# Patient Record
Sex: Female | Born: 1950
Health system: Southern US, Community
[De-identification: ages and names within clinical notes are randomized; demographics above are authoritative.]

## PROBLEM LIST (undated history)

## (undated) DIAGNOSIS — E079 Disorder of thyroid, unspecified: Secondary | ICD-10-CM

## (undated) DIAGNOSIS — I499 Cardiac arrhythmia, unspecified: Secondary | ICD-10-CM

## (undated) DIAGNOSIS — I10 Essential (primary) hypertension: Secondary | ICD-10-CM

## (undated) DIAGNOSIS — L509 Urticaria, unspecified: Secondary | ICD-10-CM

## (undated) HISTORY — DX: Urticaria, unspecified: L50.9

## (undated) HISTORY — PX: BREAST SURGERY: SHX581

## (undated) HISTORY — PX: BREAST EXCISIONAL BIOPSY: SUR124

## (undated) HISTORY — DX: Cardiac arrhythmia, unspecified: I49.9

## (undated) HISTORY — DX: Disorder of thyroid, unspecified: E07.9

## (undated) HISTORY — DX: Essential (primary) hypertension: I10

## (undated) HISTORY — PX: HERNIA REPAIR: SHX51

---

## 1983-12-23 HISTORY — PX: BREAST SURGERY: SHX581

## 1998-12-12 ENCOUNTER — Other Ambulatory Visit: Admission: RE | Admit: 1998-12-12 | Discharge: 1998-12-12 | Payer: Self-pay | Admitting: Gynecology

## 1999-05-09 ENCOUNTER — Other Ambulatory Visit: Admission: RE | Admit: 1999-05-09 | Discharge: 1999-05-09 | Payer: Self-pay | Admitting: Gynecology

## 1999-05-09 ENCOUNTER — Inpatient Hospital Stay (HOSPITAL_COMMUNITY): Admission: AD | Admit: 1999-05-09 | Discharge: 1999-05-09 | Payer: Self-pay | Admitting: Gynecology

## 1999-06-20 ENCOUNTER — Encounter (INDEPENDENT_AMBULATORY_CARE_PROVIDER_SITE_OTHER): Payer: Self-pay | Admitting: Specialist

## 1999-06-20 ENCOUNTER — Inpatient Hospital Stay (HOSPITAL_COMMUNITY): Admission: RE | Admit: 1999-06-20 | Discharge: 1999-06-21 | Payer: Self-pay | Admitting: Gynecology

## 1999-08-22 ENCOUNTER — Other Ambulatory Visit: Admission: RE | Admit: 1999-08-22 | Discharge: 1999-08-22 | Payer: Self-pay | Admitting: Gynecology

## 1999-09-05 ENCOUNTER — Encounter: Payer: Self-pay | Admitting: Surgery

## 1999-09-05 ENCOUNTER — Ambulatory Visit (HOSPITAL_COMMUNITY): Admission: RE | Admit: 1999-09-05 | Discharge: 1999-09-05 | Payer: Self-pay | Admitting: Surgery

## 1999-09-26 ENCOUNTER — Ambulatory Visit (HOSPITAL_BASED_OUTPATIENT_CLINIC_OR_DEPARTMENT_OTHER): Admission: RE | Admit: 1999-09-26 | Discharge: 1999-09-26 | Payer: Self-pay | Admitting: Surgery

## 2000-07-07 ENCOUNTER — Other Ambulatory Visit: Admission: RE | Admit: 2000-07-07 | Discharge: 2000-07-07 | Payer: Self-pay | Admitting: Gynecology

## 2001-06-18 ENCOUNTER — Other Ambulatory Visit: Admission: RE | Admit: 2001-06-18 | Discharge: 2001-06-18 | Payer: Self-pay | Admitting: Gynecology

## 2002-06-30 ENCOUNTER — Other Ambulatory Visit: Admission: RE | Admit: 2002-06-30 | Discharge: 2002-06-30 | Payer: Self-pay | Admitting: Gynecology

## 2003-10-19 ENCOUNTER — Other Ambulatory Visit: Admission: RE | Admit: 2003-10-19 | Discharge: 2003-10-19 | Payer: Self-pay | Admitting: Gynecology

## 2004-08-21 ENCOUNTER — Encounter: Admission: RE | Admit: 2004-08-21 | Discharge: 2004-08-21 | Payer: Self-pay | Admitting: Internal Medicine

## 2004-10-21 ENCOUNTER — Other Ambulatory Visit: Admission: RE | Admit: 2004-10-21 | Discharge: 2004-10-21 | Payer: Self-pay | Admitting: Gynecology

## 2005-09-23 ENCOUNTER — Encounter: Admission: RE | Admit: 2005-09-23 | Discharge: 2005-09-23 | Payer: Self-pay | Admitting: Gynecology

## 2005-12-16 ENCOUNTER — Other Ambulatory Visit: Admission: RE | Admit: 2005-12-16 | Discharge: 2005-12-16 | Payer: Self-pay | Admitting: Gynecology

## 2006-11-03 ENCOUNTER — Encounter: Admission: RE | Admit: 2006-11-03 | Discharge: 2006-11-03 | Payer: Self-pay | Admitting: Gynecology

## 2006-12-21 ENCOUNTER — Other Ambulatory Visit: Admission: RE | Admit: 2006-12-21 | Discharge: 2006-12-21 | Payer: Self-pay | Admitting: Gynecology

## 2007-12-21 ENCOUNTER — Other Ambulatory Visit: Admission: RE | Admit: 2007-12-21 | Discharge: 2007-12-21 | Payer: Self-pay | Admitting: Gynecology

## 2008-04-06 ENCOUNTER — Encounter: Admission: RE | Admit: 2008-04-06 | Discharge: 2008-04-06 | Payer: Self-pay | Admitting: Gynecology

## 2009-01-19 ENCOUNTER — Encounter: Admission: RE | Admit: 2009-01-19 | Discharge: 2009-01-19 | Payer: Self-pay | Admitting: Family Medicine

## 2009-06-08 ENCOUNTER — Encounter: Admission: RE | Admit: 2009-06-08 | Discharge: 2009-06-08 | Payer: Self-pay | Admitting: Gynecology

## 2010-10-10 ENCOUNTER — Encounter: Admission: RE | Admit: 2010-10-10 | Discharge: 2010-10-10 | Payer: Self-pay | Admitting: Gynecology

## 2011-12-01 ENCOUNTER — Ambulatory Visit: Payer: Self-pay | Admitting: Cardiology

## 2011-12-18 ENCOUNTER — Ambulatory Visit: Payer: Self-pay | Admitting: Cardiology

## 2011-12-19 ENCOUNTER — Encounter: Payer: Self-pay | Admitting: Cardiology

## 2011-12-19 ENCOUNTER — Ambulatory Visit (INDEPENDENT_AMBULATORY_CARE_PROVIDER_SITE_OTHER): Payer: 59 | Admitting: Cardiology

## 2011-12-19 VITALS — BP 122/80 | HR 62 | Ht 62.0 in | Wt 101.1 lb

## 2011-12-19 DIAGNOSIS — R011 Cardiac murmur, unspecified: Secondary | ICD-10-CM

## 2011-12-19 DIAGNOSIS — I119 Hypertensive heart disease without heart failure: Secondary | ICD-10-CM

## 2011-12-19 DIAGNOSIS — R002 Palpitations: Secondary | ICD-10-CM

## 2011-12-19 NOTE — Assessment & Plan Note (Signed)
The patient's blood pressure is borderline elevated today when it was first taken.  On repeat it was down to 122/80.  She should avoid added salt

## 2011-12-19 NOTE — Progress Notes (Signed)
Irving Shows Date of Birth:  05/20/1951 [facility] [address]   [address] [place], [place]  [address] [phone]           Fax   [phone]  History of Present Illness: This pleasant 60 year old married Caucasian female is seen after a long absence.  Her old records are not presently obtainable.  We saw her many years ago for palpitations.  She has a history of a known heart murmur.  She does not recall ever having had an echocardiogram. She comes in today for evaluation of palpitations.  Her palpitations are very infrequent.  They start suddenly.  They last only a few minutes and then gradually subside.  She had several episodes last summer and she has had occasional episodes waking her up at night.  He has not had any recent spells.  She has noticed that coffee or ice T. are triggers for her palpitations.  Current Outpatient Prescriptions  Medication Sig Dispense Refill  . aspirin 81 MG tablet Take 81 mg by mouth daily.        . calcium citrate-vitamin D (CITRACAL+D) 315-200 MG-UNIT per tablet Take 1 tablet by mouth 2 (two) times daily.        Marland Kitchen losartan (COZAAR) 100 MG tablet Take 100 mg by mouth daily.        . nitrofurantoin (MACRODANTIN) 100 MG capsule Take 100 mg by mouth at bedtime.        . vitamin B-12 (CYANOCOBALAMIN) 1000 MCG tablet Take 1,000 mcg by mouth daily.          Allergies  Allergen Reactions  . Codeine     There is no problem list on file for this patient.   History  Smoking status  . Never Smoker   Smokeless tobacco  . Not on file    History  Alcohol Use No    Family History  Problem Relation Age of Onset  . Hypertension Mother   . Heart attack Father   . Heart disease Father   . Hypertension Father   . Diabetes Father   . Hypertension Brother     Review of Systems: Constitutional: no fever chills diaphoresis or fatigue or change in weight.  Head and neck: no hearing loss, no epistaxis, no  photophobia or visual disturbance. Respiratory: No cough, shortness of breath or wheezing. Cardiovascular: No chest pain peripheral edema, palpitations. Gastrointestinal: No abdominal distention, no abdominal pain, no change in bowel habits hematochezia or melena. Genitourinary: No dysuria, no frequency, no urgency, no nocturia. Musculoskeletal:No arthralgias, no back pain, no gait disturbance or myalgias. Neurological: No dizziness, no headaches, no numbness, no seizures, no syncope, no weakness, no tremors. Hematologic: No lymphadenopathy, no easy bruising. Psychiatric: No confusion, no hallucinations, no sleep disturbance.    Physical Exam: Filed Vitals:   12/19/11 1532  BP: 122/80  Pulse: 62  The general appearance reveals a well-developed well-nourished woman in no distress.Pupils equal and reactive.   Extraocular Movements are full.  There is no scleral icterus.  The mouth and pharynx are normal.  The neck is supple.  The carotids reveal no bruits.  The jugular venous pressure is normal.  The thyroid is not enlarged.  There is no lymphadenopathy.  The chest is clear to percussion and auscultation. There are no rales or rhonchi. Expansion of the chest is symmetrical.  Heart reveals a grade 1/6 apical systolic murmur.  No definite click.  No gallop or rub.  No  diastolic murmur.The abdomen is soft and nontender. Bowel sounds are normal. The liver and spleen are not enlarged. There Are no abdominal masses. There are no bruits.  The pedal pulses are good.  There is no phlebitis or edema.  There is no cyanosis or clubbing. Strength is normal and symmetrical in all extremities.  There is no lateralizing weakness.  There are no sensory deficits.  The skin is warm and dry.  There is no rash.  EKG today shows sinus rhythm and is within normal limits   Assessment / Plan: My impression is that the patient is probably having short runs of SVT as the cause of her palpitations.  The  palpitations are very infrequent.  She really has not had a severe spell since June.  We will get echocardiogram and chest x-ray and plan to see her back in 6 months.  If episodes become more frequent we will try to document what they are with a event monitor

## 2011-12-19 NOTE — Assessment & Plan Note (Signed)
The patient has a soft apical systolic murmur.  She does not have any symptoms of congestive heart failure.  Her EKG is normal.  We are sending her for an chest x-ray and an echocardiogram

## 2011-12-19 NOTE — Patient Instructions (Signed)
Will have you go for Chest Xray soon and call you with the results  Your physician has requested that you have an echocardiogram. Echocardiography is a painless test that uses sound waves to create images of your heart. It provides your doctor with information about the size and shape of your heart and how well your heart's chambers and valves are working. This procedure takes approximately one hour. There are no restrictions for this procedure.  Your physician recommends that you continue on your current medications as directed. Please refer to the Current Medication list given to you today.  Your physician wants you to follow-up in: 6 month You will receive a reminder letter in the mail two months in advance. If you don't receive a letter, please call our office to schedule the follow-up appointment.

## 2011-12-19 NOTE — Assessment & Plan Note (Signed)
Her palpitations are very infrequent and they are very brief period.  There is no reason at this point to have her wear a event monitor.  We did not today about maneuvers that she could try to shorten the episodes such as Valsalva maneuver.  I suspect that what she is having is short runs of SVT.  Her EKG today is normal.  There is no evidence of preexcitation.

## 2011-12-22 ENCOUNTER — Ambulatory Visit (INDEPENDENT_AMBULATORY_CARE_PROVIDER_SITE_OTHER)
Admission: RE | Admit: 2011-12-22 | Discharge: 2011-12-22 | Disposition: A | Discharge status: Home / Self Care | Payer: 59 | Source: Ambulatory Visit | Attending: Cardiology | Admitting: Cardiology

## 2011-12-22 DIAGNOSIS — R002 Palpitations: Secondary | ICD-10-CM

## 2011-12-22 DIAGNOSIS — R011 Cardiac murmur, unspecified: Secondary | ICD-10-CM

## 2011-12-22 DIAGNOSIS — I119 Hypertensive heart disease without heart failure: Secondary | ICD-10-CM

## 2011-12-25 ENCOUNTER — Telehealth: Payer: Self-pay | Admitting: *Deleted

## 2011-12-25 ENCOUNTER — Telehealth: Payer: Self-pay | Admitting: Cardiology

## 2011-12-25 NOTE — Telephone Encounter (Signed)
Patient returned call and advised of chest Xray results

## 2011-12-25 NOTE — Telephone Encounter (Signed)
Message copied by Burnell Blanks on Thu Dec 25, 2011  9:38 AM ------      Message from: Cassell Clement      Created: Tue Dec 23, 2011  8:12 AM       Xray normal.  Please report.

## 2011-12-29 ENCOUNTER — Ambulatory Visit (HOSPITAL_COMMUNITY): Payer: 59 | Attending: Cardiology | Admitting: Radiology

## 2011-12-29 DIAGNOSIS — R002 Palpitations: Secondary | ICD-10-CM | POA: Insufficient documentation

## 2011-12-29 DIAGNOSIS — I10 Essential (primary) hypertension: Secondary | ICD-10-CM | POA: Insufficient documentation

## 2011-12-29 DIAGNOSIS — R011 Cardiac murmur, unspecified: Secondary | ICD-10-CM | POA: Insufficient documentation

## 2011-12-29 DIAGNOSIS — I119 Hypertensive heart disease without heart failure: Secondary | ICD-10-CM

## 2012-01-05 NOTE — Telephone Encounter (Signed)
Left message

## 2012-01-05 NOTE — Telephone Encounter (Signed)
FU Call: Pt returning call from our office regarding pt ECHO results. Please return pt call to discuss further.  

## 2012-01-06 ENCOUNTER — Telehealth: Payer: Self-pay | Admitting: *Deleted

## 2012-01-06 NOTE — Telephone Encounter (Signed)
Echo results given to patient. 

## 2012-01-06 NOTE — Telephone Encounter (Signed)
Advised of results

## 2012-01-06 NOTE — Telephone Encounter (Signed)
F/U  Patient returning Nurse MP call.  Patient can be reached on mobile.

## 2012-01-06 NOTE — Telephone Encounter (Signed)
Message copied by Burnell Blanks on Tue Jan 06, 2012  5:46 PM ------      Message from: Cassell Clement      Created: Mon Dec 29, 2011  9:40 PM       Please report.  Echo shows normal LV systolic function.  There is mild MR and AI which account for the murmur.  No new Rx needed at this time.

## 2015-10-29 ENCOUNTER — Telehealth: Payer: Self-pay | Admitting: Cardiology

## 2015-10-29 NOTE — Telephone Encounter (Signed)
Spoke with patient and she stated she was sick a few weeks ago and that is when she noticed.  Patient was concerned and felt needed to be checked out Advised to start with seeing PCP and if he felt cardiac related he could get her an appointment with someone. Per  Dr. Mare Ferrari Did advise if she did need to see cardiologist it would not be with  Dr. Mare Ferrari since he is retiring soon

## 2015-10-29 NOTE — Telephone Encounter (Signed)
New Message  Pt c/o Shortness Of Breath: STAT if SOB developed within the last 24 hours or pt is noticeably SOB on the phone  1. Are you currently SOB (can you hear that pt is SOB on the phone)? No  2. How long have you been experiencing SOB? 24- 48 Hours  3. Are you SOB when sitting or when up moving around? Moving around .Marland Kitchen Walking  4.  Are you currently experiencing any other symptoms? No  Comments: Pt is not established! Last seen 2012. Ok per [employer]. Pt is aware that they are not established. Would prefer to see Dr. Mare Ferrari. Please assist.

## 2016-04-25 DIAGNOSIS — L308 Other specified dermatitis: Secondary | ICD-10-CM | POA: Diagnosis not present

## 2016-04-25 DIAGNOSIS — L218 Other seborrheic dermatitis: Secondary | ICD-10-CM | POA: Diagnosis not present

## 2016-04-25 DIAGNOSIS — L718 Other rosacea: Secondary | ICD-10-CM | POA: Diagnosis not present

## 2016-05-22 DIAGNOSIS — Z681 Body mass index (BMI) 19 or less, adult: Secondary | ICD-10-CM | POA: Diagnosis not present

## 2016-05-22 DIAGNOSIS — Z01419 Encounter for gynecological examination (general) (routine) without abnormal findings: Secondary | ICD-10-CM | POA: Diagnosis not present

## 2016-05-22 DIAGNOSIS — Z1231 Encounter for screening mammogram for malignant neoplasm of breast: Secondary | ICD-10-CM | POA: Diagnosis not present

## 2016-05-22 DIAGNOSIS — E559 Vitamin D deficiency, unspecified: Secondary | ICD-10-CM | POA: Diagnosis not present

## 2016-07-02 DIAGNOSIS — M810 Age-related osteoporosis without current pathological fracture: Secondary | ICD-10-CM | POA: Diagnosis not present

## 2016-07-02 DIAGNOSIS — E039 Hypothyroidism, unspecified: Secondary | ICD-10-CM | POA: Diagnosis not present

## 2016-07-02 DIAGNOSIS — E063 Autoimmune thyroiditis: Secondary | ICD-10-CM | POA: Diagnosis not present

## 2016-07-02 DIAGNOSIS — I10 Essential (primary) hypertension: Secondary | ICD-10-CM | POA: Diagnosis not present

## 2016-07-02 DIAGNOSIS — Z1211 Encounter for screening for malignant neoplasm of colon: Secondary | ICD-10-CM | POA: Diagnosis not present

## 2016-07-02 DIAGNOSIS — Z1239 Encounter for other screening for malignant neoplasm of breast: Secondary | ICD-10-CM | POA: Diagnosis not present

## 2016-07-02 DIAGNOSIS — Z23 Encounter for immunization: Secondary | ICD-10-CM | POA: Diagnosis not present

## 2016-09-06 DIAGNOSIS — J069 Acute upper respiratory infection, unspecified: Secondary | ICD-10-CM | POA: Diagnosis not present

## 2016-11-12 DIAGNOSIS — J069 Acute upper respiratory infection, unspecified: Secondary | ICD-10-CM | POA: Diagnosis not present

## 2017-01-10 DIAGNOSIS — J069 Acute upper respiratory infection, unspecified: Secondary | ICD-10-CM | POA: Diagnosis not present

## 2017-01-10 DIAGNOSIS — R05 Cough: Secondary | ICD-10-CM | POA: Diagnosis not present

## 2017-02-23 DIAGNOSIS — Z1211 Encounter for screening for malignant neoplasm of colon: Secondary | ICD-10-CM | POA: Diagnosis not present

## 2017-06-17 DIAGNOSIS — R252 Cramp and spasm: Secondary | ICD-10-CM | POA: Diagnosis not present

## 2017-06-17 DIAGNOSIS — E039 Hypothyroidism, unspecified: Secondary | ICD-10-CM | POA: Diagnosis not present

## 2017-06-17 DIAGNOSIS — Z13220 Encounter for screening for lipoid disorders: Secondary | ICD-10-CM | POA: Diagnosis not present

## 2017-06-17 DIAGNOSIS — N952 Postmenopausal atrophic vaginitis: Secondary | ICD-10-CM | POA: Diagnosis not present

## 2017-06-17 DIAGNOSIS — Z0000 Encounter for general adult medical examination without abnormal findings: Secondary | ICD-10-CM | POA: Diagnosis not present

## 2017-06-17 DIAGNOSIS — M810 Age-related osteoporosis without current pathological fracture: Secondary | ICD-10-CM | POA: Diagnosis not present

## 2017-06-17 DIAGNOSIS — Z1159 Encounter for screening for other viral diseases: Secondary | ICD-10-CM | POA: Diagnosis not present

## 2017-06-17 DIAGNOSIS — I10 Essential (primary) hypertension: Secondary | ICD-10-CM | POA: Diagnosis not present

## 2017-06-22 ENCOUNTER — Other Ambulatory Visit: Payer: Self-pay | Admitting: Family Medicine

## 2017-06-22 DIAGNOSIS — Z1231 Encounter for screening mammogram for malignant neoplasm of breast: Secondary | ICD-10-CM

## 2017-07-14 ENCOUNTER — Ambulatory Visit
Admission: RE | Admit: 2017-07-14 | Discharge: 2017-07-14 | Disposition: A | Discharge status: Home / Self Care | Payer: PPO | Source: Ambulatory Visit | Attending: Family Medicine | Admitting: Family Medicine

## 2017-07-14 DIAGNOSIS — Z1231 Encounter for screening mammogram for malignant neoplasm of breast: Secondary | ICD-10-CM | POA: Diagnosis not present

## 2017-08-10 DIAGNOSIS — M810 Age-related osteoporosis without current pathological fracture: Secondary | ICD-10-CM | POA: Diagnosis not present

## 2017-08-25 DIAGNOSIS — M25572 Pain in left ankle and joints of left foot: Secondary | ICD-10-CM | POA: Diagnosis not present

## 2018-05-19 DIAGNOSIS — H5203 Hypermetropia, bilateral: Secondary | ICD-10-CM | POA: Diagnosis not present

## 2018-05-20 DIAGNOSIS — L299 Pruritus, unspecified: Secondary | ICD-10-CM | POA: Diagnosis not present

## 2018-06-21 ENCOUNTER — Other Ambulatory Visit: Payer: Self-pay | Admitting: Family Medicine

## 2018-06-21 DIAGNOSIS — Z1231 Encounter for screening mammogram for malignant neoplasm of breast: Secondary | ICD-10-CM

## 2018-07-16 ENCOUNTER — Ambulatory Visit
Admission: RE | Admit: 2018-07-16 | Discharge: 2018-07-16 | Disposition: A | Discharge status: Home / Self Care | Payer: PPO | Source: Ambulatory Visit | Attending: Family Medicine | Admitting: Family Medicine

## 2018-07-16 DIAGNOSIS — Z1231 Encounter for screening mammogram for malignant neoplasm of breast: Secondary | ICD-10-CM | POA: Diagnosis not present

## 2018-09-23 DIAGNOSIS — L218 Other seborrheic dermatitis: Secondary | ICD-10-CM | POA: Diagnosis not present

## 2018-11-22 DIAGNOSIS — L509 Urticaria, unspecified: Secondary | ICD-10-CM | POA: Diagnosis not present

## 2018-11-22 DIAGNOSIS — L218 Other seborrheic dermatitis: Secondary | ICD-10-CM | POA: Diagnosis not present

## 2018-12-03 DIAGNOSIS — J040 Acute laryngitis: Secondary | ICD-10-CM | POA: Diagnosis not present

## 2018-12-03 DIAGNOSIS — R05 Cough: Secondary | ICD-10-CM | POA: Diagnosis not present

## 2018-12-07 DIAGNOSIS — Z1389 Encounter for screening for other disorder: Secondary | ICD-10-CM | POA: Diagnosis not present

## 2018-12-07 DIAGNOSIS — E039 Hypothyroidism, unspecified: Secondary | ICD-10-CM | POA: Diagnosis not present

## 2018-12-07 DIAGNOSIS — R8290 Unspecified abnormal findings in urine: Secondary | ICD-10-CM | POA: Diagnosis not present

## 2018-12-07 DIAGNOSIS — I10 Essential (primary) hypertension: Secondary | ICD-10-CM | POA: Diagnosis not present

## 2018-12-07 DIAGNOSIS — Z0000 Encounter for general adult medical examination without abnormal findings: Secondary | ICD-10-CM | POA: Diagnosis not present

## 2018-12-07 DIAGNOSIS — J040 Acute laryngitis: Secondary | ICD-10-CM | POA: Diagnosis not present

## 2018-12-07 DIAGNOSIS — M810 Age-related osteoporosis without current pathological fracture: Secondary | ICD-10-CM | POA: Diagnosis not present

## 2018-12-07 DIAGNOSIS — L509 Urticaria, unspecified: Secondary | ICD-10-CM | POA: Diagnosis not present

## 2018-12-07 DIAGNOSIS — Z13220 Encounter for screening for lipoid disorders: Secondary | ICD-10-CM | POA: Diagnosis not present

## 2018-12-10 ENCOUNTER — Encounter: Payer: Self-pay | Admitting: Cardiology

## 2019-01-10 DIAGNOSIS — R944 Abnormal results of kidney function studies: Secondary | ICD-10-CM | POA: Diagnosis not present

## 2019-01-11 DIAGNOSIS — L270 Generalized skin eruption due to drugs and medicaments taken internally: Secondary | ICD-10-CM | POA: Diagnosis not present

## 2019-01-11 DIAGNOSIS — L509 Urticaria, unspecified: Secondary | ICD-10-CM | POA: Diagnosis not present

## 2019-01-11 DIAGNOSIS — M310 Hypersensitivity angiitis: Secondary | ICD-10-CM | POA: Diagnosis not present

## 2019-01-21 NOTE — Progress Notes (Signed)
Cardiology Office Note   Date:  01/24/2019   ID:  Shambria, Camerer 1951/08/12, MRN [id number]  PCP:  Maury Dus, MD  Cardiologist:   Shrihan Putt Martinique, MD   Chief Complaint  Patient presents with  . Palpitations      History of Present Illness: Nicole Myers is a 68 y.o. female who is seen at the request of Dr Alyson Ingles for evaluation of palpitations. Seen by Dr. Mare Ferrari in the past for evaluation of infrequent palpitations. Last seen in 2012. Noted to have a soft murmur. Echo done showing mild MR and AI. CXR normal. Never wore a monitor at that time.   More recently has experienced sporadic palpitations. Started a few months ago. Feels her heart rate increasing. May go up into the 100s. Typically resolves with taking a deep breath and resting. No clear triggers. Palpitations may occur several times a day but then go away for a couple of weeks. Denies any chest pain, dizziness, syncope, or dyspnea.  She does not drink caffeine or use tobacco products. Thyroid dose has not changed.    Past Medical History:  Diagnosis Date  . Hives   . Hypertension   . Irregular heart beat   . Thyroid disease     Past Surgical History:  Procedure Laterality Date  . BREAST SURGERY    . CESAREAN SECTION     x2  . HERNIA REPAIR       Current Outpatient Medications  Medication Sig Dispense Refill  . aspirin 81 MG tablet Take 81 mg by mouth daily.      . calcium citrate-vitamin D (CITRACAL+D) 315-200 MG-UNIT per tablet Take 1 tablet by mouth 2 (two) times daily.      . clobetasol cream (TEMOVATE) 0.05 %     . fluocinonide (LIDEX) 0.05 % external solution     . hydrOXYzine (ATARAX/VISTARIL) 25 MG tablet TAKE 1 TABLET BY MOUTH EVERY DAY AT NIGHT    . olmesartan (BENICAR) 20 MG tablet daily.    Marland Kitchen levothyroxine (SYNTHROID) 50 MCG tablet Take 0.5 tablets (25 mcg total) by mouth daily before breakfast.     No current facility-administered medications for this visit.     Allergies:    Codeine    Social History:  The patient  reports that she has never smoked. She has never used smokeless tobacco. She reports that she does not drink alcohol or use drugs.   Family History:  The patient's family history includes Dementia in her mother; Diabetes in her father; Heart attack in her father; Heart disease in her father; Hypertension in her brother, father, and mother.    ROS:  Please see the history of present illness.   Otherwise, review of systems are positive for hives.   All other systems are reviewed and negative.    PHYSICAL EXAM: VS:  BP 122/84   Pulse 63   Ht 5\' 2"  (1.575 m)   Wt 96 lb 3.2 oz (43.6 kg)   BMI 17.60 kg/m  , BMI Body mass index is 17.6 kg/m. GEN: Well nourished, well developed, in no acute distress  HEENT: normal  Neck: no JVD, carotid bruits, or masses Cardiac: RRR; soft 1/6 systolic murmur LSB. No  rubs or gallops,no edema  Respiratory:  clear to auscultation bilaterally, normal work of breathing GI: soft, nontender, nondistended, + BS MS: no deformity or atrophy  Skin: warm and dry, no rash Neuro:  Strength and sensation are intact Psych: euthymic mood, full affect  EKG:  EKG is ordered today. The ekg ordered today demonstrates NSR rate 63. Normal. I have personally reviewed and interpreted this study.    Recent Labs: No results found for requested labs within last 8760 hours.    Lipid Panel No results found for: CHOL, TRIG, HDL, CHOLHDL, VLDL, LDLCALC, LDLDIRECT   Dated 12/07/18: cholesterol 153, triglycerides 132, HDL 50, LDL 76. CBC, ALT, TSH normal Dated 01/10/19: normal BMET    Wt Readings from Last 3 Encounters:  01/24/19 96 lb 3.2 oz (43.6 kg)  12/19/11 101 lb 1.9 oz (45.9 kg)      Other studies Reviewed: Additional studies/ records that were reviewed today include:   Echo 12/29/11: Study Conclusions  - Left ventricle: The cavity size was normal. Wall thickness was normal. Systolic function was normal. The  estimated ejection fraction was in the range of 60% to 65%. Wall motion was normal; there were no regional wall motion abnormalities. Features are consistent with a pseudonormal left ventricular filling pattern, with concomitant abnormal relaxation and increased filling pressure (grade 2 diastolic dysfunction). - Aortic valve: Mild regurgitation. - Mitral valve: Mild regurgitation.   ASSESSMENT AND PLAN:  1.  Palpitations. Sporadic. Etiology unclear. Will have her wear an event monitor to document.  Current medicines are reviewed at length with the patient today.  The patient does not have concerns regarding medicines.  The following changes have been made:  no change  Labs/ tests ordered today include:   Orders Placed This Encounter  Procedures  . Cardiac event monitor  . EKG 12-Lead     Disposition:   FU TBD depending on results of monitor.   Signed, Kalyan Barabas Martinique, MD  01/24/2019 3:13 PM    [facility] [address] [place], [place], [address] Phone [phone], Fax [phone]

## 2019-01-24 ENCOUNTER — Ambulatory Visit: Payer: PPO | Admitting: Cardiology

## 2019-01-24 ENCOUNTER — Encounter: Payer: Self-pay | Admitting: Cardiology

## 2019-01-24 VITALS — BP 122/84 | HR 63 | Ht 62.0 in | Wt 96.2 lb

## 2019-01-24 DIAGNOSIS — R002 Palpitations: Secondary | ICD-10-CM | POA: Diagnosis not present

## 2019-01-24 MED ORDER — LEVOTHYROXINE SODIUM 50 MCG PO TABS: 25.0000 ug | ORAL_TABLET | Freq: Every day | ORAL | Status: DC

## 2019-01-24 NOTE — Patient Instructions (Signed)
Medication Instructions:  Continue same medications If you need a refill on your cardiac medications before your next appointment, please call your pharmacy.   Lab work: None ordered   Testing/Procedures: Schedule 30 day event monitor  Follow-Up: At [facility], you and your health needs are our priority.  As part of our continuing mission to provide you with exceptional heart care, we have created designated Provider Care Teams.  These Care Teams include your primary Cardiologist (physician) and Advanced Practice Providers (APPs -  Physician Assistants and Nurse Practitioners) who all work together to provide you with the care you need, when you need it. . Follow Up will be determined after monitor

## 2019-02-03 DIAGNOSIS — J3089 Other allergic rhinitis: Secondary | ICD-10-CM | POA: Diagnosis not present

## 2019-02-03 DIAGNOSIS — J301 Allergic rhinitis due to pollen: Secondary | ICD-10-CM | POA: Diagnosis not present

## 2019-02-03 DIAGNOSIS — L501 Idiopathic urticaria: Secondary | ICD-10-CM | POA: Diagnosis not present

## 2019-02-03 DIAGNOSIS — J3081 Allergic rhinitis due to animal (cat) (dog) hair and dander: Secondary | ICD-10-CM | POA: Diagnosis not present

## 2019-02-09 ENCOUNTER — Ambulatory Visit (INDEPENDENT_AMBULATORY_CARE_PROVIDER_SITE_OTHER): Payer: PPO

## 2019-02-09 DIAGNOSIS — R002 Palpitations: Secondary | ICD-10-CM | POA: Diagnosis not present

## 2019-02-17 DIAGNOSIS — H6123 Impacted cerumen, bilateral: Secondary | ICD-10-CM | POA: Diagnosis not present

## 2019-02-17 DIAGNOSIS — S00419A Abrasion of unspecified ear, initial encounter: Secondary | ICD-10-CM | POA: Diagnosis not present

## 2019-06-16 DIAGNOSIS — I10 Essential (primary) hypertension: Secondary | ICD-10-CM | POA: Diagnosis not present

## 2019-06-16 DIAGNOSIS — E039 Hypothyroidism, unspecified: Secondary | ICD-10-CM | POA: Diagnosis not present

## 2019-06-16 DIAGNOSIS — G2581 Restless legs syndrome: Secondary | ICD-10-CM | POA: Diagnosis not present

## 2019-06-16 DIAGNOSIS — M810 Age-related osteoporosis without current pathological fracture: Secondary | ICD-10-CM | POA: Diagnosis not present

## 2019-07-01 ENCOUNTER — Other Ambulatory Visit: Payer: Self-pay | Admitting: Family Medicine

## 2019-07-01 DIAGNOSIS — Z1231 Encounter for screening mammogram for malignant neoplasm of breast: Secondary | ICD-10-CM

## 2019-08-12 ENCOUNTER — Ambulatory Visit: Payer: PPO

## 2019-08-18 DIAGNOSIS — R220 Localized swelling, mass and lump, head: Secondary | ICD-10-CM | POA: Diagnosis not present

## 2019-08-18 DIAGNOSIS — I10 Essential (primary) hypertension: Secondary | ICD-10-CM | POA: Diagnosis not present

## 2019-09-19 ENCOUNTER — Other Ambulatory Visit: Payer: Self-pay

## 2019-09-19 ENCOUNTER — Ambulatory Visit
Admission: RE | Admit: 2019-09-19 | Discharge: 2019-09-19 | Disposition: A | Discharge status: Home / Self Care | Payer: PPO | Source: Ambulatory Visit | Attending: Family Medicine | Admitting: Family Medicine

## 2019-09-19 DIAGNOSIS — Z1231 Encounter for screening mammogram for malignant neoplasm of breast: Secondary | ICD-10-CM | POA: Diagnosis not present

## 2020-03-12 DIAGNOSIS — H2513 Age-related nuclear cataract, bilateral: Secondary | ICD-10-CM | POA: Diagnosis not present

## 2020-04-19 DIAGNOSIS — Z1389 Encounter for screening for other disorder: Secondary | ICD-10-CM | POA: Diagnosis not present

## 2020-04-19 DIAGNOSIS — G2581 Restless legs syndrome: Secondary | ICD-10-CM | POA: Diagnosis not present

## 2020-04-19 DIAGNOSIS — Z0000 Encounter for general adult medical examination without abnormal findings: Secondary | ICD-10-CM | POA: Diagnosis not present

## 2020-04-19 DIAGNOSIS — E063 Autoimmune thyroiditis: Secondary | ICD-10-CM | POA: Diagnosis not present

## 2020-04-19 DIAGNOSIS — I10 Essential (primary) hypertension: Secondary | ICD-10-CM | POA: Diagnosis not present

## 2020-04-19 DIAGNOSIS — M810 Age-related osteoporosis without current pathological fracture: Secondary | ICD-10-CM | POA: Diagnosis not present

## 2020-04-19 DIAGNOSIS — Z13220 Encounter for screening for lipoid disorders: Secondary | ICD-10-CM | POA: Diagnosis not present

## 2020-08-20 ENCOUNTER — Other Ambulatory Visit: Payer: Self-pay | Admitting: Family Medicine

## 2020-08-20 DIAGNOSIS — Z1231 Encounter for screening mammogram for malignant neoplasm of breast: Secondary | ICD-10-CM

## 2020-09-19 ENCOUNTER — Ambulatory Visit
Admission: RE | Admit: 2020-09-19 | Discharge: 2020-09-19 | Disposition: A | Discharge status: Home / Self Care | Payer: PPO | Source: Ambulatory Visit | Attending: Family Medicine | Admitting: Family Medicine

## 2020-09-19 ENCOUNTER — Other Ambulatory Visit: Payer: Self-pay

## 2020-09-19 DIAGNOSIS — Z1231 Encounter for screening mammogram for malignant neoplasm of breast: Secondary | ICD-10-CM | POA: Diagnosis not present

## 2020-10-19 DIAGNOSIS — M810 Age-related osteoporosis without current pathological fracture: Secondary | ICD-10-CM | POA: Diagnosis not present

## 2020-10-19 DIAGNOSIS — I10 Essential (primary) hypertension: Secondary | ICD-10-CM | POA: Diagnosis not present

## 2020-10-19 DIAGNOSIS — G2581 Restless legs syndrome: Secondary | ICD-10-CM | POA: Diagnosis not present

## 2020-10-19 DIAGNOSIS — E063 Autoimmune thyroiditis: Secondary | ICD-10-CM | POA: Diagnosis not present

## 2020-11-02 ENCOUNTER — Other Ambulatory Visit: Payer: Self-pay | Admitting: Family Medicine

## 2020-11-02 DIAGNOSIS — M810 Age-related osteoporosis without current pathological fracture: Secondary | ICD-10-CM

## 2021-01-03 DIAGNOSIS — Z1152 Encounter for screening for COVID-19: Secondary | ICD-10-CM | POA: Diagnosis not present

## 2021-02-14 ENCOUNTER — Other Ambulatory Visit: Payer: PPO

## 2021-02-15 ENCOUNTER — Ambulatory Visit
Admission: RE | Admit: 2021-02-15 | Discharge: 2021-02-15 | Disposition: A | Discharge status: Home / Self Care | Payer: PPO | Source: Ambulatory Visit | Attending: Family Medicine | Admitting: Family Medicine

## 2021-02-15 ENCOUNTER — Other Ambulatory Visit: Payer: Self-pay

## 2021-02-15 DIAGNOSIS — M8588 Other specified disorders of bone density and structure, other site: Secondary | ICD-10-CM | POA: Diagnosis not present

## 2021-02-15 DIAGNOSIS — M810 Age-related osteoporosis without current pathological fracture: Secondary | ICD-10-CM

## 2021-03-13 DIAGNOSIS — H2513 Age-related nuclear cataract, bilateral: Secondary | ICD-10-CM | POA: Diagnosis not present

## 2021-03-13 DIAGNOSIS — H04123 Dry eye syndrome of bilateral lacrimal glands: Secondary | ICD-10-CM | POA: Diagnosis not present

## 2021-03-13 DIAGNOSIS — H43811 Vitreous degeneration, right eye: Secondary | ICD-10-CM | POA: Diagnosis not present

## 2021-04-18 DIAGNOSIS — H2512 Age-related nuclear cataract, left eye: Secondary | ICD-10-CM | POA: Diagnosis not present

## 2021-04-23 DIAGNOSIS — M810 Age-related osteoporosis without current pathological fracture: Secondary | ICD-10-CM | POA: Diagnosis not present

## 2021-04-23 DIAGNOSIS — G2581 Restless legs syndrome: Secondary | ICD-10-CM | POA: Diagnosis not present

## 2021-04-23 DIAGNOSIS — E063 Autoimmune thyroiditis: Secondary | ICD-10-CM | POA: Diagnosis not present

## 2021-04-23 DIAGNOSIS — Z0000 Encounter for general adult medical examination without abnormal findings: Secondary | ICD-10-CM | POA: Diagnosis not present

## 2021-04-23 DIAGNOSIS — I10 Essential (primary) hypertension: Secondary | ICD-10-CM | POA: Diagnosis not present

## 2021-04-23 DIAGNOSIS — Z1389 Encounter for screening for other disorder: Secondary | ICD-10-CM | POA: Diagnosis not present

## 2021-05-01 DIAGNOSIS — H2511 Age-related nuclear cataract, right eye: Secondary | ICD-10-CM | POA: Diagnosis not present

## 2021-05-02 DIAGNOSIS — H2511 Age-related nuclear cataract, right eye: Secondary | ICD-10-CM | POA: Diagnosis not present

## 2021-06-17 ENCOUNTER — Telehealth: Payer: Self-pay | Admitting: Cardiology

## 2021-06-17 DIAGNOSIS — R002 Palpitations: Secondary | ICD-10-CM

## 2021-06-17 NOTE — Telephone Encounter (Signed)
Spoke with pt who report yesterday she had an episode of palpations that lasted 2 hours. Pt state when it first started, she felt lightheaded and as if she was going to faint. Pt state HR increased to 141 and continued in the 130's even while laying down. Pt state since the monitor she hasn't had many episodes but recently has had a little more than normal. She state they usually only last a few sec but  been feeling lightheaded during each episode. Pt state she is just concerned because yesterday it lasted for over 2 hours.   Will forward to MD for review.

## 2021-06-17 NOTE — Telephone Encounter (Signed)
Patient c/o Palpitations:  High priority if patient c/o lightheadedness, shortness of breath, or chest pain  How long have you had palpitations/irregular HR/ Afib? Are you having the symptoms now? No symptoms now, pt had symptoms yesterday that lasted for 2 hours  Are you currently experiencing lightheadedness, SOB or CP?  No, Pt did have SOB yesterday but nothing today  Do you have a history of afib (atrial fibrillation) or irregular heart rhythm? Yes, pt has a history of SOME palpitations  Have you checked your BP or HR? (document readings if available): HR 141-yesterday  Are you experiencing any other symptoms? No symptoms now  106/in the 60's

## 2021-06-17 NOTE — Telephone Encounter (Signed)
Would recommend Zio patch for 2 weeks then arrange follow up visit. Could see APP  Carmyn Hamm Martinique MD, [facility]

## 2021-06-18 ENCOUNTER — Other Ambulatory Visit: Payer: Self-pay | Admitting: *Deleted

## 2021-06-18 ENCOUNTER — Ambulatory Visit (INDEPENDENT_AMBULATORY_CARE_PROVIDER_SITE_OTHER): Payer: PPO

## 2021-06-18 DIAGNOSIS — R002 Palpitations: Secondary | ICD-10-CM

## 2021-06-18 NOTE — Progress Notes (Unsigned)
Patient enrolled for [employer] to ship a 14 day ZIO XT monitor to address on file.

## 2021-06-18 NOTE — Telephone Encounter (Signed)
Spoke to patient Dr.Jordan's advice given.14 day monitor will be mailed with instructions on how to apply.Follow up appointment scheduled with Laurann Montana PA at [facility] location 8/5 at 8:00 am.

## 2021-06-20 DIAGNOSIS — R002 Palpitations: Secondary | ICD-10-CM

## 2021-07-10 DIAGNOSIS — R002 Palpitations: Secondary | ICD-10-CM | POA: Diagnosis not present

## 2021-07-24 NOTE — Progress Notes (Signed)
Office Visit    Patient Name: Nicole Myers Date of Encounter: 07/26/2021  PCP:  Maury Dus, [place]  Cardiologist:  Peter Martinique, MD  Advanced Practice Provider:  No care team member to display Electrophysiologist:  None   Chief Complaint    Nicole Myers is a 70 y.o. female with a hx of palpitations, PAT, HTN, hypothyroidism presents today for follow up after ZIO   Past Medical History    Past Medical History:  Diagnosis Date   Hives    Hypertension    Irregular heart beat    Thyroid disease    Past Surgical History:  Procedure Laterality Date   BREAST SURGERY     CESAREAN SECTION     x2   HERNIA REPAIR      Allergies  Allergies  Allergen Reactions   Codeine     History of Present Illness    Nicole Myers is a 70 y.o. female with a hx of palpitations, PAT, HTN, hypothyroidism last seen 01/24/19.  She was seen last 01/2019 by Dr. Martinique noting palpitations. They often resolved with deep breath and rest. She wore a monitor with 3 brief runs of SVT. She was offered Toprol '25mg'$  QD but as symptoms were not frequent she declined.   She contacted the office 06/17/21 noting palpitations and was recommended for 14 day ZIO monitor. ZIo showed multiple episodes of PAT with longest 11 seconds which was increased in frequency compared to previous.  She presents today for follow up.  We reviewed her ZIO monitor report report in detail. She has a 78 month old Duvall which has been keeping her very active. Tells me she had not had spells of fast palpitations for some time until they suddenly began recurring when to contact the office.  Reports palpitations are still occurring daily and sometimes lasting up to hours.  She has reduced caffeine intake with no significant change.  She does not take over-the-counter proarrhythmic medications.  She does note that she may be dehydrated.  Reports no recent stressors. Reports no  shortness of breath nor dyspnea on exertion. Reports no chest pain, pressure, or tightness. No edema, orthopnea, PND.   EKGs/Labs/Other Studies Reviewed:   The following studies were reviewed today:  ZIO 07/10/21 Normal sinus rhythm Rare isolated PACs and PVCs Multiple episodes of nonsustained PAT longest 11 seconds. Symptoms do not consistently correlate with arrhythmia     Patch Wear Time:  13 days and 23 hours (2022-06-30T11:19:29-0400 to 2022-07-14T10:45:28-0400)   Patient had a min HR of 49 bpm, max HR of 207 bpm, and avg HR of 72 bpm. Predominant underlying rhythm was Sinus Rhythm. 144 Supraventricular Tachycardia runs occurred, the run with the fastest interval lasting 7.5 secs with a max rate of 207 bpm, the longest lasting 11.0 secs with an avg rate of 150 bpm. Some episodes of Supraventricular Tachycardia may be possible Atrial Tachycardia with variable block. Isolated SVEs were rare (<1.0%), SVE Couplets were rare (<1.0%), and SVE Triplets were rare (<1.0%). Isolated VEs were rare (<1.0%, 1101), VE Couplets were rare (<1.0%, 1), and VE Triplets were rare (<1.0%, 1).   Echo 12/29/11: Study Conclusions  - Left ventricle: The cavity size was normal. Wall thickness   was normal. Systolic function was normal. The estimated   ejection fraction was in the range of 60% to 65%. Wall   motion was normal; there were no regional wall motion   abnormalities. Features  are consistent with a pseudonormal   left ventricular filling pattern, with concomitant   abnormal relaxation and increased filling pressure (grade   2 diastolic dysfunction). - Aortic valve: Mild regurgitation. - Mitral valve: Mild regurgitation.  EKG:  EKG is  ordered today.  The ekg ordered today demonstrates SB 58 bpm with no acute ST/T wave monitor.   Recent Labs: No results found for requested labs within last 8760 hours.  Recent Lipid Panel No results found for: CHOL, TRIG, HDL, CHOLHDL, VLDL, LDLCALC,  LDLDIRECT   Home Medications   Current Meds  Medication Sig   calcium citrate-vitamin D (CITRACAL+D) 315-200 MG-UNIT per tablet Take 1 tablet by mouth 2 (two) times daily.     clobetasol cream (TEMOVATE) 0.05 %    fluocinonide (LIDEX) 0.05 % external solution    levothyroxine (SYNTHROID) 50 MCG tablet Take 0.5 tablets (25 mcg total) by mouth daily before breakfast.   metoprolol succinate (TOPROL XL) 25 MG 24 hr tablet Take 1 tablet (25 mg total) by mouth daily.   olmesartan (BENICAR) 20 MG tablet daily.   raloxifene (EVISTA) 60 MG tablet Take 60 mg by mouth daily.   rOPINIRole (REQUIP) 1 MG tablet Take 1 mg by mouth 3 (three) times daily.   [DISCONTINUED] hydrOXYzine (ATARAX/VISTARIL) 25 MG tablet TAKE 1 TABLET BY MOUTH EVERY DAY AT NIGHT     Review of Systems      All other systems reviewed and are otherwise negative except as noted above.  Physical Exam    VS:  BP 136/88   Pulse (!) 58   Ht '5\' 2"'$  (1.575 m)   Wt 105 lb 6.4 oz (47.8 kg)   BMI 19.28 kg/m  , BMI Body mass index is 19.28 kg/m.  Wt Readings from Last 3 Encounters:  07/26/21 105 lb 6.4 oz (47.8 kg)  01/24/19 96 lb 3.2 oz (43.6 kg)  12/19/11 101 lb 1.9 oz (45.9 kg)    GEN: Well nourished, well developed, in no acute distress. HEENT: normal. Neck: Supple, no JVD, carotid bruits, or masses. Cardiac: RRR, no murmurs, rubs, or gallops. No clubbing, cyanosis, edema.  Radials/PT 2+ and equal bilaterally.  Respiratory:  Respirations regular and unlabored, clear to auscultation bilaterally. GI: Soft, nontender, nondistended. MS: No deformity or atrophy. Skin: Warm and dry, no rash. Neuro:  Strength and sensation are intact. Psych: Normal affect.  Assessment & Plan    Palpitations / PAT -ZIO monitor with more frequent episodes of PAT year.  Reports nearly daily episodes of palpitations.  Start Toprol 25 mg daily.  Encouraged to increase hydration, continue to avoid caffeine.  No proarrhythmic agents noted.  Lab  work 04/2021 with no evidence of anemia, normal thyroid function on current dose of levothyroxine.  No indication for repeat lab work at this time.  HTN - BP well controlled. Continue current antihypertensive regimen.    HLD -managed with diet and exercise.  Hypothyroidism- Recent TSH within range. Continue to follow with PCP.   Disposition: Follow up in 3 month(s) with Dr. Martinique or APP.  Signed, Loel Dubonnet, NP 07/26/2021, 9:20 AM [place]

## 2021-07-26 ENCOUNTER — Ambulatory Visit (HOSPITAL_BASED_OUTPATIENT_CLINIC_OR_DEPARTMENT_OTHER): Payer: PPO | Admitting: Family

## 2021-07-26 ENCOUNTER — Other Ambulatory Visit: Payer: Self-pay

## 2021-07-26 ENCOUNTER — Encounter (HOSPITAL_BASED_OUTPATIENT_CLINIC_OR_DEPARTMENT_OTHER): Payer: Self-pay | Admitting: Family

## 2021-07-26 VITALS — BP 136/88 | HR 58 | Ht 62.0 in | Wt 105.4 lb

## 2021-07-26 DIAGNOSIS — I10 Essential (primary) hypertension: Secondary | ICD-10-CM | POA: Diagnosis not present

## 2021-07-26 DIAGNOSIS — R002 Palpitations: Secondary | ICD-10-CM | POA: Diagnosis not present

## 2021-07-26 DIAGNOSIS — I471 Supraventricular tachycardia: Secondary | ICD-10-CM | POA: Diagnosis not present

## 2021-07-26 MED ORDER — METOPROLOL SUCCINATE ER 25 MG PO TB24: 25.0000 mg | ORAL_TABLET | Freq: Every day | ORAL | 5 refills | Status: DC

## 2021-07-26 NOTE — Patient Instructions (Addendum)
Medication Instructions:  Your physician has recommended you make the following change in your medication:   START Metoprolol Succinate one '25mg'$  tablet daily  *If you need a refill on your cardiac medications before your next appointment, please call your pharmacy*  Lab Work: None ordered today.   Testing/Procedures: Your EKG today showed sinus bradycardia which is a stable heart rhythm.   Follow-Up: At [facility], you and your health needs are our priority.  As part of our continuing mission to provide you with exceptional heart care, we have created designated Provider Care Teams.  These Care Teams include your primary Cardiologist (physician) and Advanced Practice Providers (APPs -  Physician Assistants and Nurse Practitioners) who all work together to provide you with the care you need, when you need it.  We recommend signing up for the patient portal called "MyChart".  Sign up information is provided on this After Visit Summary.  MyChart is used to connect with patients for Virtual Visits (Telemedicine).  Patients are able to view lab/test results, encounter notes, upcoming appointments, etc.  Non-urgent messages can be sent to your provider as well.   To learn more about what you can do with MyChart, go to [email or web address].    Your next appointment:   3 month(s)  The format for your next appointment:   In Person  Provider:   You may see Peter Martinique, MD or one of the following Advanced Practice Providers on your designated Care Team:   Rosaria Ferries, PA-C Caron Presume, PA-C Jory Sims, DNP, ANP Loel Dubonnet, NP    Other Instructions  To prevent palpitations: Make sure you are adequately hydrated.  Avoid and/or limit caffeine containing beverages like soda or tea. Exercise regularly.  Manage stress well. Some over the counter medications can cause palpitations such as Benadryl, AdvilPM, TylenolPM. Regular Advil or Tylenol do not cause  palpitations.

## 2021-08-06 ENCOUNTER — Other Ambulatory Visit: Payer: Self-pay | Admitting: Family Medicine

## 2021-08-06 DIAGNOSIS — Z1231 Encounter for screening mammogram for malignant neoplasm of breast: Secondary | ICD-10-CM

## 2021-09-11 DIAGNOSIS — N760 Acute vaginitis: Secondary | ICD-10-CM | POA: Diagnosis not present

## 2021-09-23 ENCOUNTER — Ambulatory Visit
Admission: RE | Admit: 2021-09-23 | Discharge: 2021-09-23 | Disposition: A | Discharge status: Home / Self Care | Payer: PPO | Source: Ambulatory Visit | Attending: Family Medicine | Admitting: Family Medicine

## 2021-09-23 ENCOUNTER — Other Ambulatory Visit: Payer: Self-pay

## 2021-09-23 DIAGNOSIS — Z1231 Encounter for screening mammogram for malignant neoplasm of breast: Secondary | ICD-10-CM

## 2021-10-02 DIAGNOSIS — Z23 Encounter for immunization: Secondary | ICD-10-CM | POA: Diagnosis not present

## 2021-10-07 DIAGNOSIS — G43B0 Ophthalmoplegic migraine, not intractable: Secondary | ICD-10-CM | POA: Diagnosis not present

## 2021-10-07 DIAGNOSIS — Z961 Presence of intraocular lens: Secondary | ICD-10-CM | POA: Diagnosis not present

## 2021-10-07 DIAGNOSIS — H04123 Dry eye syndrome of bilateral lacrimal glands: Secondary | ICD-10-CM | POA: Diagnosis not present

## 2021-10-07 DIAGNOSIS — H43811 Vitreous degeneration, right eye: Secondary | ICD-10-CM | POA: Diagnosis not present

## 2021-10-24 DIAGNOSIS — G2581 Restless legs syndrome: Secondary | ICD-10-CM | POA: Diagnosis not present

## 2021-10-24 DIAGNOSIS — R002 Palpitations: Secondary | ICD-10-CM | POA: Diagnosis not present

## 2021-10-24 DIAGNOSIS — J069 Acute upper respiratory infection, unspecified: Secondary | ICD-10-CM | POA: Diagnosis not present

## 2021-10-24 DIAGNOSIS — E039 Hypothyroidism, unspecified: Secondary | ICD-10-CM | POA: Diagnosis not present

## 2021-10-24 DIAGNOSIS — M810 Age-related osteoporosis without current pathological fracture: Secondary | ICD-10-CM | POA: Diagnosis not present

## 2021-10-24 DIAGNOSIS — I10 Essential (primary) hypertension: Secondary | ICD-10-CM | POA: Diagnosis not present

## 2021-11-05 ENCOUNTER — Ambulatory Visit (HOSPITAL_BASED_OUTPATIENT_CLINIC_OR_DEPARTMENT_OTHER): Payer: PPO | Admitting: Family

## 2021-11-27 ENCOUNTER — Encounter (HOSPITAL_BASED_OUTPATIENT_CLINIC_OR_DEPARTMENT_OTHER): Payer: Self-pay | Admitting: Family

## 2021-11-27 ENCOUNTER — Other Ambulatory Visit: Payer: Self-pay

## 2021-11-27 ENCOUNTER — Ambulatory Visit (HOSPITAL_BASED_OUTPATIENT_CLINIC_OR_DEPARTMENT_OTHER): Payer: PPO | Admitting: Family

## 2021-11-27 VITALS — BP 104/62 | HR 50 | Ht 62.0 in | Wt 105.0 lb

## 2021-11-27 DIAGNOSIS — R002 Palpitations: Secondary | ICD-10-CM | POA: Diagnosis not present

## 2021-11-27 DIAGNOSIS — E782 Mixed hyperlipidemia: Secondary | ICD-10-CM | POA: Diagnosis not present

## 2021-11-27 DIAGNOSIS — E039 Hypothyroidism, unspecified: Secondary | ICD-10-CM | POA: Diagnosis not present

## 2021-11-27 DIAGNOSIS — Z8616 Personal history of COVID-19: Secondary | ICD-10-CM | POA: Diagnosis not present

## 2021-11-27 DIAGNOSIS — R051 Acute cough: Secondary | ICD-10-CM | POA: Diagnosis not present

## 2021-11-27 DIAGNOSIS — I471 Supraventricular tachycardia: Secondary | ICD-10-CM | POA: Diagnosis not present

## 2021-11-27 MED ORDER — METOPROLOL SUCCINATE ER 25 MG PO TB24: 25.0000 mg | ORAL_TABLET | Freq: Every day | ORAL | 3 refills | Status: DC

## 2021-11-27 MED ORDER — BENZONATATE 100 MG PO CAPS: 100.0000 mg | ORAL_CAPSULE | Freq: Three times a day (TID) | ORAL | 0 refills | Status: DC | PRN

## 2021-11-27 NOTE — Progress Notes (Signed)
Office Visit    Patient Name: Nicole Myers Date of Encounter: 11/27/2021  PCP:  Maury Dus, [place]  Cardiologist:  Peter Martinique, MD  Advanced Practice Provider:  No care team member to display Electrophysiologist:  None   Chief Complaint    Nicole Myers is a 70 y.o. female with a hx of palpitations, PAT, HTN, hypothyroidism presents today for follow up after addition of Toprol.   Past Medical History    Past Medical History:  Diagnosis Date   Hives    Hypertension    Irregular heart beat    Thyroid disease    Past Surgical History:  Procedure Laterality Date   BREAST SURGERY Right 1985   CESAREAN SECTION     x2   HERNIA REPAIR      Allergies  Allergies  Allergen Reactions   Codeine     History of Present Illness    Nicole Myers is a 70 y.o. female with a hx of palpitations, PAT, HTN, hypothyroidism last seen 07/26/21.  She was seen last 01/2019 by Dr. Martinique noting palpitations. They often resolved with deep breath and rest. She wore a monitor with 3 brief runs of SVT. She was offered Toprol 25mg  QD but as symptoms were not frequent she declined.   She contacted the office 06/17/21 noting palpitations and was recommended for 14 day ZIO monitor. ZIo showed multiple episodes of PAT with longest 11 seconds which was increased in frequency compared to previous. When seen 07/26/21 Toprol 25mg  QD was initiated.   She presents today for follow up. Since last seen her primary care provider has transitioned her from Olmesartan to Irbesartan.  Tells me she had an allergic reaction with itching to olmesartan but tolerates irbesartan without difficulty.  Tells me had COVID over Thanksgiving. Tells me her taste has not yet returned though is improving. Still with residual cough which is nonproductive.  No shortness of breath nor dyspnea on exertion.  Denies chest pain, pressure, tightness.  Tells me since starting Toprol only has  had a couple episodes of palpitations. She feels much improved since starting the Toprol.  No edema, orthopnea, PND, lightheadedness, dizziness, near-syncope, syncope.  EKGs/Labs/Other Studies Reviewed:   The following studies were reviewed today:  ZIO 07/10/21 Normal sinus rhythm Rare isolated PACs and PVCs Multiple episodes of nonsustained PAT longest 11 seconds. Symptoms do not consistently correlate with arrhythmia     Patch Wear Time:  13 days and 23 hours (2022-06-30T11:19:29-0400 to 2022-07-14T10:45:28-0400)   Patient had a min HR of 49 bpm, max HR of 207 bpm, and avg HR of 72 bpm. Predominant underlying rhythm was Sinus Rhythm. 144 Supraventricular Tachycardia runs occurred, the run with the fastest interval lasting 7.5 secs with a max rate of 207 bpm, the longest lasting 11.0 secs with an avg rate of 150 bpm. Some episodes of Supraventricular Tachycardia may be possible Atrial Tachycardia with variable block. Isolated SVEs were rare (<1.0%), SVE Couplets were rare (<1.0%), and SVE Triplets were rare (<1.0%). Isolated VEs were rare (<1.0%, 1101), VE Couplets were rare (<1.0%, 1), and VE Triplets were rare (<1.0%, 1).   Echo 12/29/11: Study Conclusions  - Left ventricle: The cavity size was normal. Wall thickness   was normal. Systolic function was normal. The estimated   ejection fraction was in the range of 60% to 65%. Wall   motion was normal; there were no regional wall motion   abnormalities. Features are  consistent with a pseudonormal   left ventricular filling pattern, with concomitant   abnormal relaxation and increased filling pressure (grade   2 diastolic dysfunction). - Aortic valve: Mild regurgitation. - Mitral valve: Mild regurgitation.  EKG:  No EKG today.  Recent Labs: No results found for requested labs within last 8760 hours.  Recent Lipid Panel No results found for: CHOL, TRIG, HDL, CHOLHDL, VLDL, LDLCALC, LDLDIRECT   Home Medications   No outpatient  medications have been marked as taking for the 11/27/21 encounter (Appointment) with Loel Dubonnet, NP.     Review of Systems      All other systems reviewed and are otherwise negative except as noted above.  Physical Exam    VS:  There were no vitals taken for this visit. , BMI There is no height or weight on file to calculate BMI.  Wt Readings from Last 3 Encounters:  07/26/21 105 lb 6.4 oz (47.8 kg)  01/24/19 96 lb 3.2 oz (43.6 kg)  12/19/11 101 lb 1.9 oz (45.9 kg)    GEN: Well nourished, well developed, in no acute distress. HEENT: normal. Neck: Supple, no JVD, carotid bruits, or masses. Cardiac: RRR, no murmurs, rubs, or gallops. No clubbing, cyanosis, edema.  Radials/PT 2+ and equal bilaterally.  Respiratory:  Respirations regular and unlabored, clear to auscultation bilaterally. GI: Soft, nontender, nondistended. MS: No deformity or atrophy. Skin: Warm and dry, no rash. Neuro:  Strength and sensation are intact. Psych: Normal affect.  Assessment & Plan    Palpitations / PAT - Since addition of Toprol 25mg  QD palpitations much improved.  Continue current dose.  Encouraged to continue to stay well-hydrated, avoid caffeine, manage stress well.  Hx of COVID 82 -she had COVID-19 over the Thanksgiving holiday.  Reports residual cough that is nonproductive.  Rx for Tessalon 3 times daily as needed sent to pharmacy.  HTN - BP well controlled. Continue current antihypertensive regimen.  Managed by primary care.   HLD -managed with diet and exercise.  Hypothyroidism-  Continue to follow with PCP.   Disposition: Follow up in 1 year with Dr. Martinique or APP.  Signed, Loel Dubonnet, NP 11/27/2021, 6:50 AM [place]

## 2021-11-27 NOTE — Patient Instructions (Addendum)
Medication Instructions:  Continue your current medications.   Start Tessalon as needed 3 times per day for cough.   *If you need a refill on your cardiac medications before your next appointment, please call your pharmacy*  Lab Work: None ordered today.   Testing/Procedures: None ordered today.   Follow-Up: At [facility], you and your health needs are our priority.  As part of our continuing mission to provide you with exceptional heart care, we have created designated Provider Care Teams.  These Care Teams include your primary Cardiologist (physician) and Advanced Practice Providers (APPs -  Physician Assistants and Nurse Practitioners) who all work together to provide you with the care you need, when you need it.  We recommend signing up for the patient portal called "MyChart".  Sign up information is provided on this After Visit Summary.  MyChart is used to connect with patients for Virtual Visits (Telemedicine).  Patients are able to view lab/test results, encounter notes, upcoming appointments, etc.  Non-urgent messages can be sent to your provider as well.   To learn more about what you can do with MyChart, go to [email or web address].    Your next appointment:   1 year  The format for your next appointment:   In Person  Provider:   Peter Martinique, MD or Loel Dubonnet, NP     Other Instructions  To prevent palpitations: Make sure you are adequately hydrated.  Avoid and/or limit caffeine containing beverages like soda or tea. Exercise regularly.  Manage stress well. Some over the counter medications can cause palpitations such as Benadryl, AdvilPM, TylenolPM. Regular Advil or Tylenol do not cause palpitations.

## 2022-01-04 DIAGNOSIS — R0981 Nasal congestion: Secondary | ICD-10-CM | POA: Diagnosis not present

## 2022-01-04 DIAGNOSIS — J019 Acute sinusitis, unspecified: Secondary | ICD-10-CM | POA: Diagnosis not present

## 2022-01-04 DIAGNOSIS — J209 Acute bronchitis, unspecified: Secondary | ICD-10-CM | POA: Diagnosis not present

## 2022-01-04 DIAGNOSIS — Z03818 Encounter for observation for suspected exposure to other biological agents ruled out: Secondary | ICD-10-CM | POA: Diagnosis not present

## 2022-01-04 DIAGNOSIS — R059 Cough, unspecified: Secondary | ICD-10-CM | POA: Diagnosis not present

## 2022-03-17 DIAGNOSIS — M25552 Pain in left hip: Secondary | ICD-10-CM | POA: Diagnosis not present

## 2022-03-17 DIAGNOSIS — M545 Low back pain, unspecified: Secondary | ICD-10-CM | POA: Diagnosis not present

## 2022-04-07 DIAGNOSIS — G43B0 Ophthalmoplegic migraine, not intractable: Secondary | ICD-10-CM | POA: Diagnosis not present

## 2022-04-07 DIAGNOSIS — H04123 Dry eye syndrome of bilateral lacrimal glands: Secondary | ICD-10-CM | POA: Diagnosis not present

## 2022-04-07 DIAGNOSIS — Z961 Presence of intraocular lens: Secondary | ICD-10-CM | POA: Diagnosis not present

## 2022-04-07 DIAGNOSIS — H43811 Vitreous degeneration, right eye: Secondary | ICD-10-CM | POA: Diagnosis not present

## 2022-04-25 DIAGNOSIS — Z0000 Encounter for general adult medical examination without abnormal findings: Secondary | ICD-10-CM | POA: Diagnosis not present

## 2022-04-25 DIAGNOSIS — E039 Hypothyroidism, unspecified: Secondary | ICD-10-CM | POA: Diagnosis not present

## 2022-04-25 DIAGNOSIS — M810 Age-related osteoporosis without current pathological fracture: Secondary | ICD-10-CM | POA: Diagnosis not present

## 2022-04-25 DIAGNOSIS — Z1331 Encounter for screening for depression: Secondary | ICD-10-CM | POA: Diagnosis not present

## 2022-04-25 DIAGNOSIS — G2581 Restless legs syndrome: Secondary | ICD-10-CM | POA: Diagnosis not present

## 2022-04-25 DIAGNOSIS — R002 Palpitations: Secondary | ICD-10-CM | POA: Diagnosis not present

## 2022-04-25 DIAGNOSIS — I10 Essential (primary) hypertension: Secondary | ICD-10-CM | POA: Diagnosis not present

## 2022-04-29 DIAGNOSIS — I788 Other diseases of capillaries: Secondary | ICD-10-CM | POA: Diagnosis not present

## 2022-04-29 DIAGNOSIS — D2361 Other benign neoplasm of skin of right upper limb, including shoulder: Secondary | ICD-10-CM | POA: Diagnosis not present

## 2022-04-29 DIAGNOSIS — L821 Other seborrheic keratosis: Secondary | ICD-10-CM | POA: Diagnosis not present

## 2022-04-29 DIAGNOSIS — L218 Other seborrheic dermatitis: Secondary | ICD-10-CM | POA: Diagnosis not present

## 2022-04-29 DIAGNOSIS — L309 Dermatitis, unspecified: Secondary | ICD-10-CM | POA: Diagnosis not present

## 2022-05-05 DIAGNOSIS — N390 Urinary tract infection, site not specified: Secondary | ICD-10-CM | POA: Diagnosis not present

## 2022-08-22 ENCOUNTER — Other Ambulatory Visit: Payer: Self-pay | Admitting: Family Medicine

## 2022-08-22 DIAGNOSIS — Z1231 Encounter for screening mammogram for malignant neoplasm of breast: Secondary | ICD-10-CM

## 2022-09-24 ENCOUNTER — Ambulatory Visit
Admission: RE | Admit: 2022-09-24 | Discharge: 2022-09-24 | Disposition: A | Discharge status: Home / Self Care | Payer: PPO | Source: Ambulatory Visit | Attending: Family Medicine | Admitting: Family Medicine

## 2022-09-24 DIAGNOSIS — Z1231 Encounter for screening mammogram for malignant neoplasm of breast: Secondary | ICD-10-CM

## 2022-11-05 DIAGNOSIS — Z23 Encounter for immunization: Secondary | ICD-10-CM | POA: Diagnosis not present

## 2022-11-05 DIAGNOSIS — G2581 Restless legs syndrome: Secondary | ICD-10-CM | POA: Diagnosis not present

## 2022-11-05 DIAGNOSIS — R002 Palpitations: Secondary | ICD-10-CM | POA: Diagnosis not present

## 2022-11-05 DIAGNOSIS — M810 Age-related osteoporosis without current pathological fracture: Secondary | ICD-10-CM | POA: Diagnosis not present

## 2022-11-05 DIAGNOSIS — E039 Hypothyroidism, unspecified: Secondary | ICD-10-CM | POA: Diagnosis not present

## 2022-11-05 DIAGNOSIS — I10 Essential (primary) hypertension: Secondary | ICD-10-CM | POA: Diagnosis not present

## 2022-11-27 NOTE — Progress Notes (Signed)
[facility]  PATIENT: Nicole Myers DOB: December 02, 1951  REFERRING CLINICIAN: Lois Huxley, PA HISTORY FROM: self REASON FOR VISIT: restless leg syndrome   HISTORICAL  CHIEF COMPLAINT:  Chief Complaint  Patient presents with   New Patient (Initial Visit)    Rm 2, alone  Concerns about RLS, states it is worse every time she is resting during the day  Pcp prescribed pregabalin 25 mg but pt did not start it     HISTORY OF PRESENT ILLNESS:  The patient presents for evaluation of RLS. She has suffered from restless legs for several years. Describes this as a jerking in her legs and a desire to move them. Sensations can alternate legs and can go all the way from her feet to her hips. Mostly occurs at night, but can occur if she is sitting or lying down for a prolonged period of time during the day.  She is currently taking ropinirole 3 mg QHS. This does cause some nausea intermittently but she otherwise tolerates it well. She was also prescribed gabapentin 300-600 mg QHS by her PCP, however she stopped this after one dose as it kept her awake all night. She was recently prescribed Lyrica, but has not started it yet.  Ferritin level in May 2023 was 59.6. She has a history of hypothyroidism, and most recent TSH was normal. States she has had hypercalcemia on her recent blood work and is planning to see endocrinology for this soon.  OTHER MEDICAL CONDITIONS: hypothyroidism, HTN, osteoporosis   REVIEW OF SYSTEMS: Full 14 system review of systems performed and negative with exception of: restless legs  ALLERGIES: Allergies  Allergen Reactions   Codeine    Olmesartan Itching    Tolerates Irbesartan    HOME MEDICATIONS: Outpatient Medications Prior to Visit  Medication Sig Dispense Refill   calcium citrate-vitamin D (CITRACAL+D) 315-200 MG-UNIT per tablet Take 1 tablet by mouth 2 (two) times daily.       clobetasol cream (TEMOVATE) 0.05 %      fluocinonide  (LIDEX) 0.05 % external solution      irbesartan (AVAPRO) 150 MG tablet Take 150 mg by mouth daily.     levothyroxine (SYNTHROID) 25 MCG tablet Take 25 mcg by mouth every morning.     metoprolol succinate (TOPROL XL) 25 MG 24 hr tablet Take 1 tablet (25 mg total) by mouth daily. 90 tablet 3   raloxifene (EVISTA) 60 MG tablet Take 60 mg by mouth daily.     rOPINIRole (REQUIP) 3 MG tablet SMARTSIG:1 Tablet(s) By Mouth Every Evening     benzonatate (TESSALON PERLES) 100 MG capsule Take 1 capsule (100 mg total) by mouth 3 (three) times daily as needed for cough. 30 capsule 0   No facility-administered medications prior to visit.    PAST MEDICAL HISTORY: Past Medical History:  Diagnosis Date   Hives    Hypertension    Irregular heart beat    Thyroid disease     PAST SURGICAL HISTORY: Past Surgical History:  Procedure Laterality Date   BREAST EXCISIONAL BIOPSY Right    BREAST SURGERY Right 1985   CESAREAN SECTION     x2   HERNIA REPAIR      FAMILY HISTORY: Family History  Problem Relation Age of Onset   Hypertension Mother    Dementia Mother    Heart attack Father    Heart disease Father    Hypertension Father    Diabetes Father    Hypertension Brother  Breast cancer Neg Hx     SOCIAL HISTORY: Social History   Socioeconomic History   Marital status: Married    Spouse name: Not on file   Number of children: 3   Years of education: Not on file   Highest education level: Not on file  Occupational History   Not on file  Tobacco Use   Smoking status: Never   Smokeless tobacco: Never  Substance and Sexual Activity   Alcohol use: No   Drug use: No   Sexual activity: Not on file  Other Topics Concern   Not on file  Social History Narrative   Not on file   Social Determinants of Health   Financial Resource Strain: Not on file  Food Insecurity: Not on file  Transportation Needs: Not on file  Physical Activity: Not on file  Stress: Not on file  Social  Connections: Not on file  Intimate Partner Violence: Not on file     PHYSICAL EXAM  GENERAL EXAM/CONSTITUTIONAL: Vitals:  Vitals:   11/28/22 0912  BP: 131/78  Pulse: (!) 52  Weight: 101 lb (45.8 kg)  Height: '5\' 2"'$  (1.575 m)   Body mass index is 18.47 kg/m. Wt Readings from Last 3 Encounters:  11/28/22 101 lb (45.8 kg)  11/27/21 105 lb (47.6 kg)  07/26/21 105 lb 6.4 oz (47.8 kg)    NEUROLOGIC: MENTAL STATUS:  awake, alert, oriented to person, place and time recent and remote memory intact normal attention and concentration language fluent, comprehension intact fund of knowledge appropriate  CRANIAL NERVE:  2nd, 3rd, 4th, 6th - pupils equal and reactive to light, visual fields full to confrontation, extraocular muscles intact, no nystagmus 5th - facial sensation symmetric 7th - facial strength symmetric 8th - hearing intact 9th - palate elevates symmetrically, uvula midline 11th - shoulder shrug symmetric 12th - tongue protrusion midline  MOTOR:  normal bulk and tone, full strength in the BUE, BLE  SENSORY:  normal and symmetric to light touch all 4 extremities  COORDINATION:  finger-nose-finger without dysmetria  REFLEXES:  deep tendon reflexes present and symmetric  GAIT/STATION:  normal     DIAGNOSTIC DATA (LABS, IMAGING, TESTING) - I reviewed patient records, labs, notes, testing and imaging myself where available.  04/27/22: Ferritin 59.6   ASSESSMENT AND PLAN  71 y.o. year old female with a history of hypothyroidism, HTN, osteoporosis who presents for evaluation of RLS. Her last ferritin level was 59.6. Will repeat iron studies today and will consider iron supplementation if ferritin levels are persistently <75. Discussed treatment options including increasing ropinirole and adding Lyrica. She would like to try increasing her ropinirole dose first. Will increase ropinirole to 3.5 mg. If no improvement after 1-2 weeks may add Lyrica 25 mg QHS.  1.  Restless leg syndrome       PLAN: -Blood work Iron studies -Increase ropinirole to 3.5 mg QHS, may increase up to 4 mg if needed -If no improvement with increased ropinirole, add Lyrica 25 mg QHS -Next steps: Will start iron supplementation if ferritin <75. Consider gabapentin encarbil if unable to tolerate Lyrica  Orders Placed This Encounter  Procedures   Iron, TIBC and Ferritin Panel    Meds ordered this encounter  Medications   rOPINIRole (REQUIP) 0.5 MG tablet    Sig: Take 1 tablet (0.5 mg total) by mouth at bedtime. Take with 3 mg pill for a total of 3.5 mg at bedtime    Dispense:  30 tablet    Refill:  5    Return in about 6 months (around 05/30/2023).    Genia Harold, MD 11/28/22 9:52 AM  I spent an average of 42 minutes chart reviewing and counseling the patient, with at least 50% of the time face to face with the patient.   [facility] [address], [place], [place] [address] [phone]

## 2022-11-28 ENCOUNTER — Encounter: Payer: Self-pay | Admitting: Psychiatry

## 2022-11-28 ENCOUNTER — Ambulatory Visit (INDEPENDENT_AMBULATORY_CARE_PROVIDER_SITE_OTHER): Payer: PPO | Admitting: Psychiatry

## 2022-11-28 VITALS — BP 131/78 | HR 52 | Ht 62.0 in | Wt 101.0 lb

## 2022-11-28 DIAGNOSIS — R799 Abnormal finding of blood chemistry, unspecified: Secondary | ICD-10-CM | POA: Diagnosis not present

## 2022-11-28 DIAGNOSIS — R790 Abnormal level of blood mineral: Secondary | ICD-10-CM | POA: Diagnosis not present

## 2022-11-28 DIAGNOSIS — G2581 Restless legs syndrome: Secondary | ICD-10-CM

## 2022-11-28 MED ORDER — ROPINIROLE HCL 0.5 MG PO TABS: 0.5000 mg | ORAL_TABLET | Freq: Every day | ORAL | 5 refills | Status: DC

## 2022-11-28 NOTE — Patient Instructions (Addendum)
Increase ropinirole to 3.5 mg at bedtime for one week. I will send 0.5 mg pills to your pharmacy. Take one 0.5 mg pill with a 3 mg pill for a total of 3.5. If still having symptoms can add pregabalin 25 mg at bedtime.  What is restless legs syndrome? Restless legs syndrome ("RLS") is a condition that causes strange sensations in your legs. If you have RLS, you probably have the urge to move your legs at night. This can make it hard to get comfortable and fall asleep.  In some cases, RLS happens on its own. In other cases, the condition seems to be linked to other medical problems. For instance, a condition called "iron deficiency anemia," in which there is too little iron in the blood, seems to increase the risk of RLS. Other conditions that increase the risk of RLS include kidney disease, diabetes, and multiple sclerosis. Pregnancy seems to increase a person's risk of developing RLS, too.  What are the symptoms of RLS? People who have RLS get an uncomfortable urge to move their legs when they are at rest. They describe the feeling as crawling, creeping, pulling, or itching. And they say that the feeling is deep in the legs (not on the skin), usually below the knees. These symptoms usually get worse as the day moves on, and they are worst at night. The symptoms can be especially bad when trying to stay still to read a book, watch TV, or fall asleep. It can be hard to do things that require sitting still for a long time. For example, symptoms can get worse if you take a long drive, fly in an airplane, or go to the theater.  People with RLS can make the feeling go away temporarily if they walk around or move their legs. Some people find that their legs move on their own while they are asleep. In short, the symptoms: ?Happen when you are at rest ?Go away if you move your legs on purpose ?Are worst at night ?Sometimes include the legs moving on their own during sleep  Together, the symptoms of RLS can  make it hard to get a good night's sleep. People with the condition often have insomnia. This means that they have trouble falling or staying asleep, or they do not feel rested when they wake up.  Is there a test for RLS? No, there is no test. Your doctor or nurse should be able to tell if you have RLS by asking about your symptoms and doing an exam. They might do blood tests to see whether you have enough iron in your blood. Your doctor or nurse might also do other tests if they think that something else could be causing your symptoms.  Is there anything I can do on my own to feel better? Yes. There are some things you can do that might help  For example, you can: ?Practice good "sleep hygiene" - This means following certain habits to improve your sleep. For example, avoid caffeine and alcohol, especially near bedtime. It also helps to avoid looking at screens before bed. ?Do activities that keep your mind alert during the day, such as puzzles. ?Stay physically active - Any activities that involve moving your body are good for you. Even gentle forms of exercise, like walking or yoga, can help. ?Massage your legs - You can also have someone massage them, if possible. Some people also feel better if they use "pneumatic compression devices." These are special sleeves that fill with  air and squeeze your legs. ?Apply heat to your legs - You can do this by using a heating pad or taking a hot bath or shower. ?Avoid taking medicines that can make RLS worse - These include antihistamines like diphenhydramine (sample brand name: Benadryl). Some over-the-counter sleep aids also contain diphenhydramine, and can make RLS worse if people try them to help with sleep. It also includes some medicines used to treat depression and other mental health problems. If you take any medicines regularly, talk to your doctor before stopping. They might recommend trying a different medicine instead.   How is RLS treated? Some  people with RLS do not need medicine for it because they have mild symptoms that don't bother them very often. If treatment is needed, there are a number of medicines doctors can suggest. Examples include: ?Iron supplements ?Gabapentin (brand name: Neurontin) ?Pregabalin (brand name: Lyrica) ?Gabapentin enacarbil (brand name: Horizant) ?Pramipexole (brand name: Mirapex) ?Ropinirole (brand name: Requip) ?Rotigotine (brand name: Neupro)

## 2022-11-29 LAB — IRON,TIBC AND FERRITIN PANEL
Ferritin: 96 ng/mL (ref 15–150)
Iron Saturation: 37 % (ref 15–55)
Iron: 115 ug/dL (ref 27–139)
Total Iron Binding Capacity: 308 ug/dL (ref 250–450)
UIBC: 193 ug/dL (ref 118–369)

## 2022-12-09 IMAGING — MG MM DIGITAL SCREENING BILAT W/ TOMO AND CAD
8 series · 9 of 24 positions shown · non-contrast
Comparison: Previous exam(s).

CLINICAL DATA: Screening.

EXAM:
DIGITAL SCREENING BILATERAL MAMMOGRAM WITH TOMOSYNTHESIS AND CAD
TECHNIQUE: Bilateral screening digital craniocaudal and mediolateral oblique
mammograms were obtained. Bilateral screening digital breast
tomosynthesis was performed. The images were evaluated with
computer-aided detection.

[R CC synth-2D]
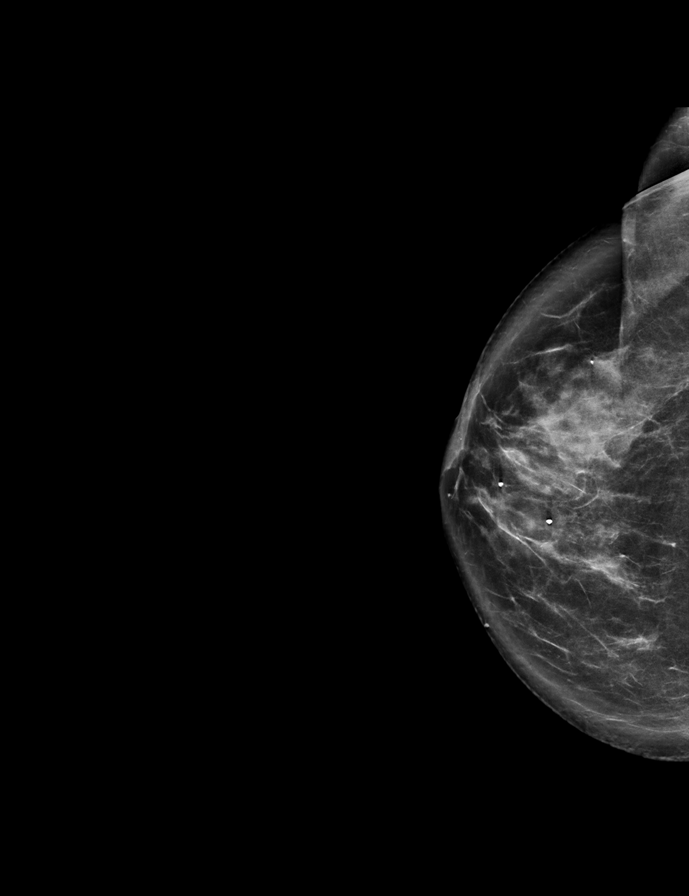

[L CC synth-2D]
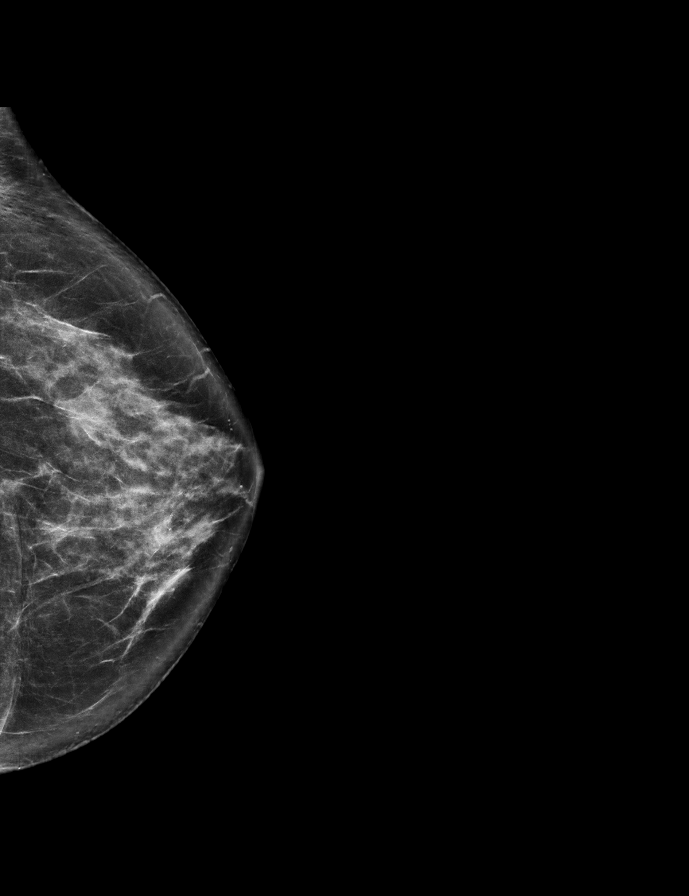

[R MLO synth-2D]
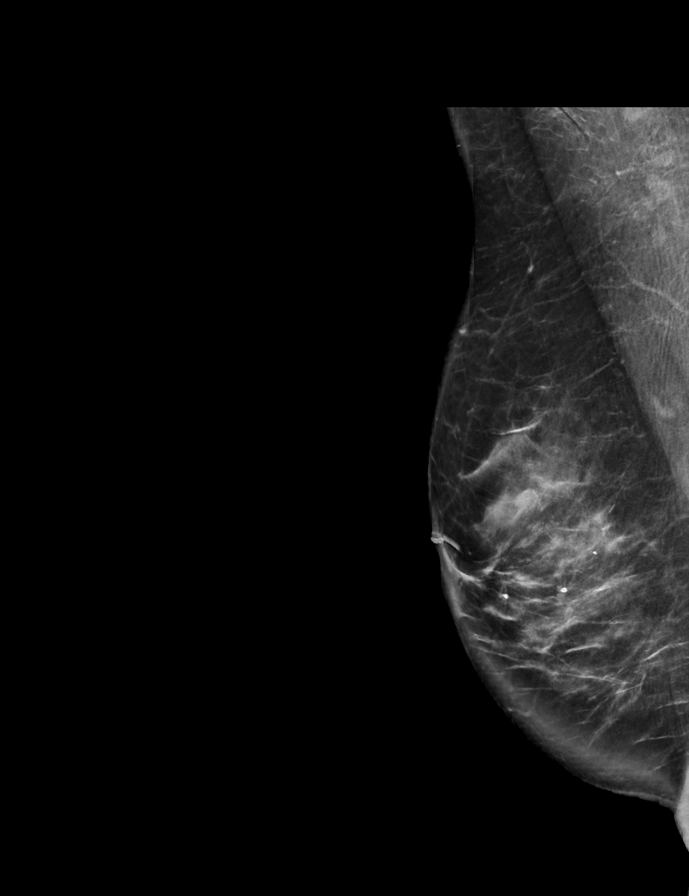

[L MLO synth-2D]
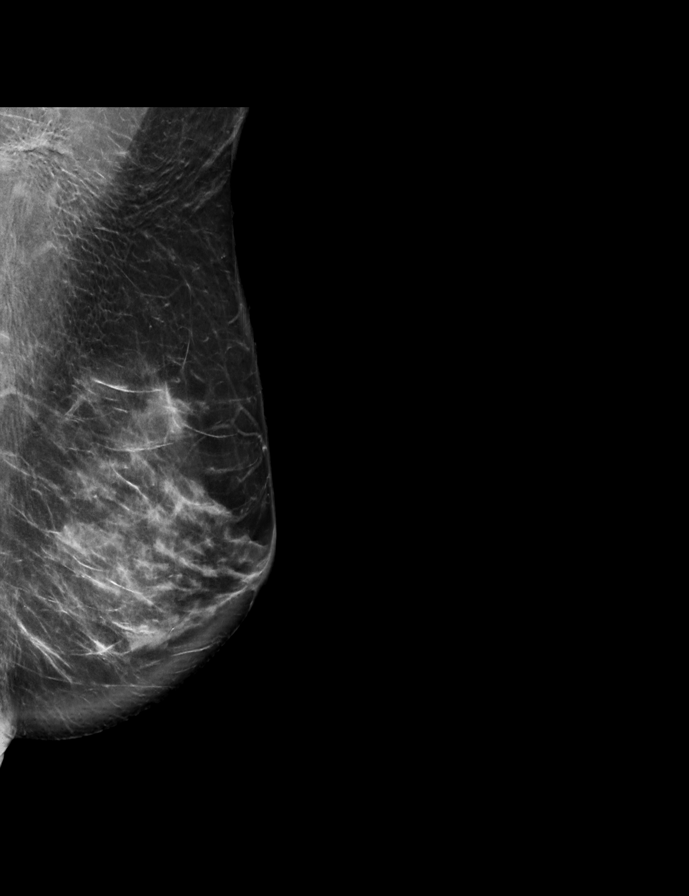

[R CC tomo · 2 of 74 frames shown]
[frame 24/74]
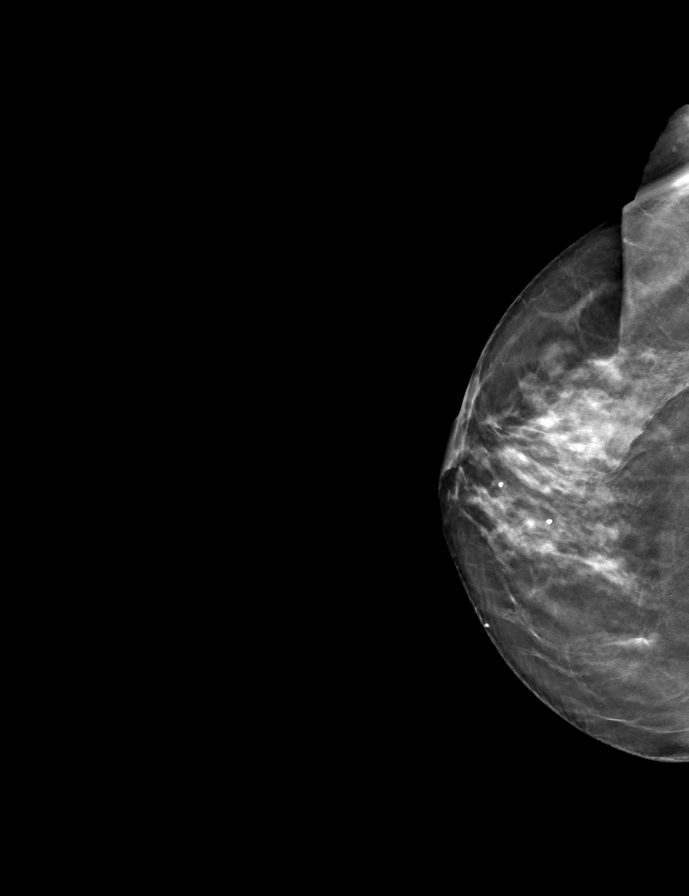
[frame 37/74]
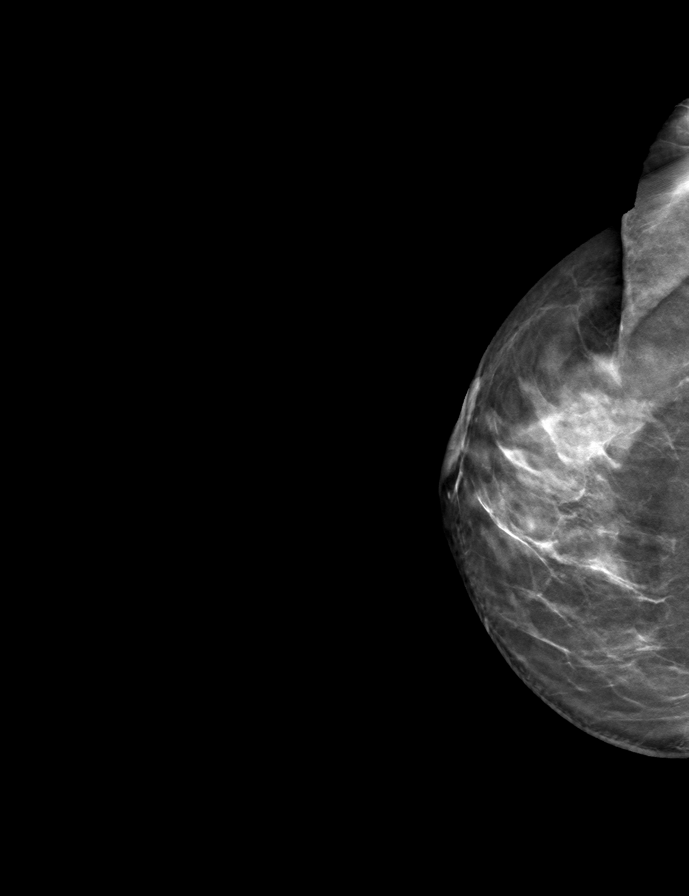

[R MLO tomo · tomo slice 39/77.0]
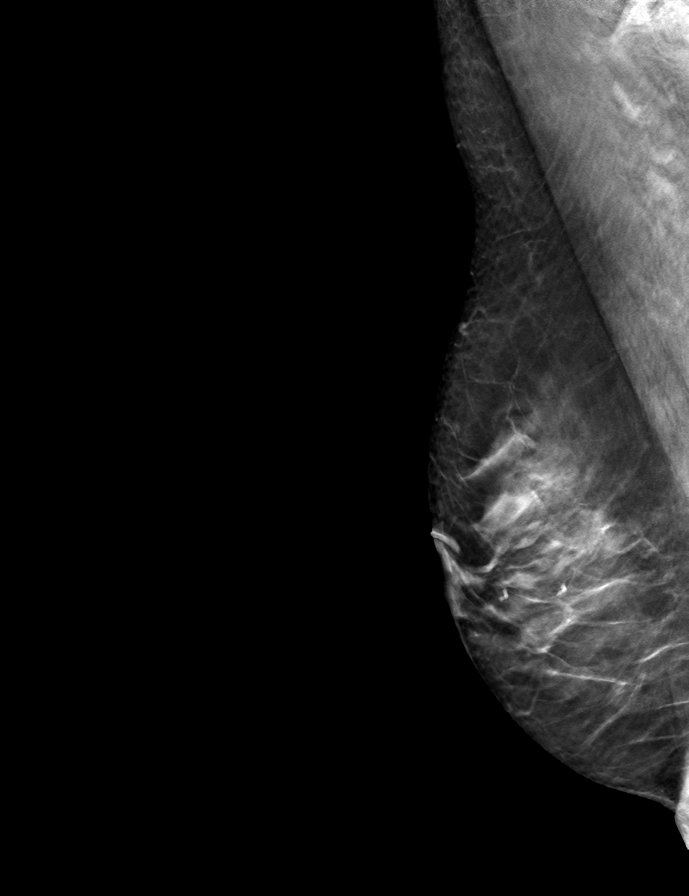

[L MLO tomo · tomo slice 41/81.0]
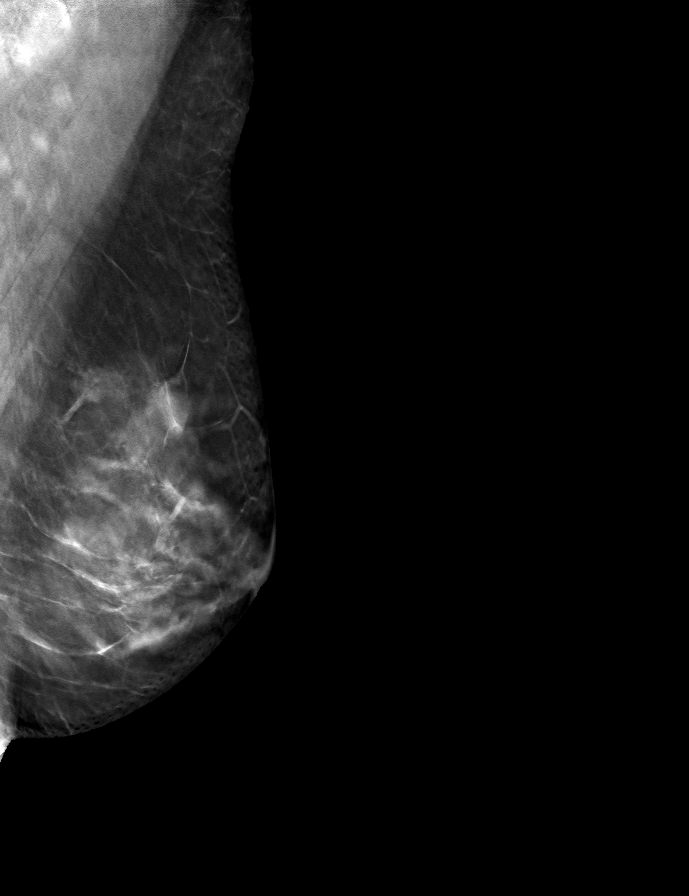

[L CC tomo · tomo slice 35/70.0]
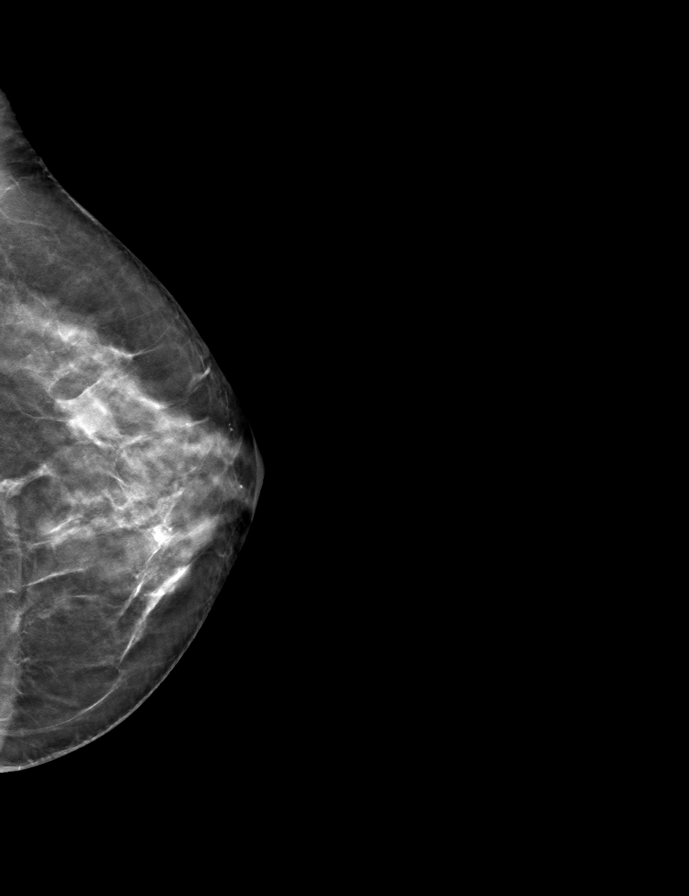

[9 of 24 positions shown; findings below may reference images not displayed]

ACR Breast Density Category c: The breast tissue is heterogeneously
dense, which may obscure small masses.
FINDINGS: There are no findings suspicious for malignancy.
IMPRESSION: No mammographic evidence of malignancy. A result letter of this
screening mammogram will be mailed directly to the patient.

RECOMMENDATION:
Screening mammogram in one year. (Code:Q3-W-BC3)

BI-RADS CATEGORY  1: Negative.

## 2022-12-25 DIAGNOSIS — M810 Age-related osteoporosis without current pathological fracture: Secondary | ICD-10-CM | POA: Diagnosis not present

## 2022-12-25 DIAGNOSIS — R300 Dysuria: Secondary | ICD-10-CM | POA: Diagnosis not present

## 2022-12-25 DIAGNOSIS — E559 Vitamin D deficiency, unspecified: Secondary | ICD-10-CM | POA: Diagnosis not present

## 2022-12-25 DIAGNOSIS — E039 Hypothyroidism, unspecified: Secondary | ICD-10-CM | POA: Diagnosis not present

## 2022-12-25 DIAGNOSIS — Z79890 Hormone replacement therapy (postmenopausal): Secondary | ICD-10-CM | POA: Diagnosis not present

## 2023-01-01 DIAGNOSIS — Z87440 Personal history of urinary (tract) infections: Secondary | ICD-10-CM | POA: Diagnosis not present

## 2023-01-01 DIAGNOSIS — R102 Pelvic and perineal pain: Secondary | ICD-10-CM | POA: Diagnosis not present

## 2023-01-16 ENCOUNTER — Other Ambulatory Visit (HOSPITAL_BASED_OUTPATIENT_CLINIC_OR_DEPARTMENT_OTHER): Payer: Self-pay | Admitting: Family

## 2023-01-16 DIAGNOSIS — I4719 Other supraventricular tachycardia: Secondary | ICD-10-CM

## 2023-01-16 DIAGNOSIS — R002 Palpitations: Secondary | ICD-10-CM

## 2023-01-16 NOTE — Telephone Encounter (Signed)
Rx(s) sent to pharmacy electronically.  

## 2023-02-12 ENCOUNTER — Other Ambulatory Visit (HOSPITAL_BASED_OUTPATIENT_CLINIC_OR_DEPARTMENT_OTHER): Payer: Self-pay | Admitting: Family

## 2023-02-12 DIAGNOSIS — R002 Palpitations: Secondary | ICD-10-CM

## 2023-02-12 DIAGNOSIS — I4719 Other supraventricular tachycardia: Secondary | ICD-10-CM

## 2023-02-16 ENCOUNTER — Other Ambulatory Visit (HOSPITAL_BASED_OUTPATIENT_CLINIC_OR_DEPARTMENT_OTHER): Payer: Self-pay | Admitting: Family

## 2023-02-16 DIAGNOSIS — I4719 Other supraventricular tachycardia: Secondary | ICD-10-CM

## 2023-02-16 DIAGNOSIS — Z13820 Encounter for screening for osteoporosis: Secondary | ICD-10-CM | POA: Diagnosis not present

## 2023-02-16 DIAGNOSIS — M810 Age-related osteoporosis without current pathological fracture: Secondary | ICD-10-CM | POA: Diagnosis not present

## 2023-02-16 DIAGNOSIS — R002 Palpitations: Secondary | ICD-10-CM

## 2023-02-16 NOTE — Telephone Encounter (Signed)
Rx(s) sent to pharmacy electronically.  

## 2023-02-26 ENCOUNTER — Other Ambulatory Visit (HOSPITAL_BASED_OUTPATIENT_CLINIC_OR_DEPARTMENT_OTHER): Payer: Self-pay | Admitting: Family

## 2023-02-26 DIAGNOSIS — I4719 Other supraventricular tachycardia: Secondary | ICD-10-CM

## 2023-02-26 DIAGNOSIS — R002 Palpitations: Secondary | ICD-10-CM

## 2023-02-26 NOTE — Telephone Encounter (Signed)
Patient of Dr. Martinique. Last ov 11/2021. Please review for refill. Thank you!

## 2023-04-13 DIAGNOSIS — Z961 Presence of intraocular lens: Secondary | ICD-10-CM | POA: Diagnosis not present

## 2023-04-13 DIAGNOSIS — H04123 Dry eye syndrome of bilateral lacrimal glands: Secondary | ICD-10-CM | POA: Diagnosis not present

## 2023-04-13 DIAGNOSIS — H43811 Vitreous degeneration, right eye: Secondary | ICD-10-CM | POA: Diagnosis not present

## 2023-04-15 ENCOUNTER — Telehealth: Payer: Self-pay | Admitting: Cardiology

## 2023-04-15 DIAGNOSIS — I4719 Other supraventricular tachycardia: Secondary | ICD-10-CM

## 2023-04-15 DIAGNOSIS — R002 Palpitations: Secondary | ICD-10-CM

## 2023-04-15 MED ORDER — METOPROLOL SUCCINATE ER 25 MG PO TB24: 25.0000 mg | ORAL_TABLET | Freq: Every day | ORAL | 1 refills | Status: DC

## 2023-04-15 NOTE — Telephone Encounter (Signed)
*  STAT* If patient is at the pharmacy, call can be transferred to refill team.   1. Which medications need to be refilled? (please list name of each medication and dose if known) metoprolol succinate (TOPROL-XL) 25 MG 24 hr tablet   2. Which pharmacy/location (including street and city if local pharmacy) is medication to be sent to?  [id number] - [place], [place] - [address]    3. Do they need a 30 day or 90 day supply? 30

## 2023-04-15 NOTE — Telephone Encounter (Signed)
Refills has been sent to the pharmacy. 

## 2023-05-08 DIAGNOSIS — R202 Paresthesia of skin: Secondary | ICD-10-CM | POA: Diagnosis not present

## 2023-05-08 DIAGNOSIS — Z1331 Encounter for screening for depression: Secondary | ICD-10-CM | POA: Diagnosis not present

## 2023-05-08 DIAGNOSIS — I10 Essential (primary) hypertension: Secondary | ICD-10-CM | POA: Diagnosis not present

## 2023-05-08 DIAGNOSIS — Z0000 Encounter for general adult medical examination without abnormal findings: Secondary | ICD-10-CM | POA: Diagnosis not present

## 2023-05-08 DIAGNOSIS — E039 Hypothyroidism, unspecified: Secondary | ICD-10-CM | POA: Diagnosis not present

## 2023-05-08 DIAGNOSIS — M810 Age-related osteoporosis without current pathological fracture: Secondary | ICD-10-CM | POA: Diagnosis not present

## 2023-05-08 DIAGNOSIS — M5412 Radiculopathy, cervical region: Secondary | ICD-10-CM | POA: Diagnosis not present

## 2023-05-08 DIAGNOSIS — R002 Palpitations: Secondary | ICD-10-CM | POA: Diagnosis not present

## 2023-05-08 DIAGNOSIS — G2581 Restless legs syndrome: Secondary | ICD-10-CM | POA: Diagnosis not present

## 2023-05-12 ENCOUNTER — Other Ambulatory Visit: Payer: Self-pay | Admitting: Family

## 2023-05-12 DIAGNOSIS — R002 Palpitations: Secondary | ICD-10-CM

## 2023-05-12 DIAGNOSIS — I4719 Other supraventricular tachycardia: Secondary | ICD-10-CM

## 2023-06-15 ENCOUNTER — Encounter (HOSPITAL_BASED_OUTPATIENT_CLINIC_OR_DEPARTMENT_OTHER): Payer: Self-pay | Admitting: Family

## 2023-06-15 ENCOUNTER — Ambulatory Visit (HOSPITAL_BASED_OUTPATIENT_CLINIC_OR_DEPARTMENT_OTHER): Payer: PPO | Admitting: Family

## 2023-06-15 VITALS — BP 118/76 | HR 49 | Ht 62.0 in | Wt 108.6 lb

## 2023-06-15 DIAGNOSIS — I10 Essential (primary) hypertension: Secondary | ICD-10-CM | POA: Diagnosis not present

## 2023-06-15 DIAGNOSIS — E039 Hypothyroidism, unspecified: Secondary | ICD-10-CM | POA: Diagnosis not present

## 2023-06-15 DIAGNOSIS — R002 Palpitations: Secondary | ICD-10-CM

## 2023-06-15 DIAGNOSIS — I4719 Other supraventricular tachycardia: Secondary | ICD-10-CM | POA: Diagnosis not present

## 2023-06-15 DIAGNOSIS — E782 Mixed hyperlipidemia: Secondary | ICD-10-CM

## 2023-06-15 MED ORDER — METOPROLOL SUCCINATE ER 25 MG PO TB24: 25.0000 mg | ORAL_TABLET | Freq: Every day | ORAL | 3 refills | Status: DC

## 2023-06-15 NOTE — Patient Instructions (Signed)
Medication Instructions:  Continue your current medications.   *If you need a refill on your cardiac medications before your next appointment, please call your pharmacy*  Follow-Up: At [facility], you and your health needs are our priority.  As part of our continuing mission to provide you with exceptional heart care, we have created designated Provider Care Teams.  These Care Teams include your primary Cardiologist (physician) and Advanced Practice Providers (APPs -  Physician Assistants and Nurse Practitioners) who all work together to provide you with the care you need, when you need it.  We recommend signing up for the patient portal called "MyChart".  Sign up information is provided on this After Visit Summary.  MyChart is used to connect with patients for Virtual Visits (Telemedicine).  Patients are able to view lab/test results, encounter notes, upcoming appointments, etc.  Non-urgent messages can be sent to your provider as well.   To learn more about what you can do with MyChart, go to [email or web address].    Your next appointment:   1 year(s)  Provider:   Peter [place], MD  or Advanced Practice Provider   Other Instructions  Heart Healthy Diet Recommendations: A low-salt diet is recommended. Meats should be grilled, baked, or boiled. Avoid fried foods. Focus on lean protein sources like fish or chicken with vegetables and fruits. The American Heart Association is a Chief Technology Officer!  American Heart Association Diet and Lifeystyle Recommendations   Exercise recommendations: The American Heart Association recommends 150 minutes of moderate intensity exercise weekly. Try 30 minutes of moderate intensity exercise 4-5 times per week. This could include walking, jogging, or swimming. We have referred you to the PREP program at the [facility].   To prevent or reduce lower extremity swelling: Eat a low salt diet. Salt makes the body hold onto extra fluid which causes  swelling. Sit with legs elevated. For example, in the recliner or on an ottoman.  Wear knee-high compression stockings during the daytime. Ones labeled 15-20 mmHg provide good compression.

## 2023-06-15 NOTE — Progress Notes (Signed)
Cardiology Office Note:  .   Date:  06/15/2023  ID:  Nicole Myers, DOB [birth year], MRN [id number] PCP: Wilfrid Lund, PA  [facility] Providers Cardiologist:  Peter Swaziland, MD    History of Present Illness: .   Nicole Myers is a 72 y.o. female with history of palpitations, PAT, hypertension, osteoporosis, hypercalcemia, hypothyroidism.  She was last seen 11/27/2021.  She was seen 01/2019 by Dr. Swaziland noting palpitations off and resolved with deep breath with subsequent monitor revealing 3 brief runs of SVT.  She was offered Toprol 25 mg daily but as symptoms were infrequent she declined.  Contacted the office 05/2021 noting palpitations with repeat 14-day ZIO monitor with multiple episodes of PAT with longest 11 seconds and at follow-up 07/2021 Toprol was initiated.  Last seen 11/27/2021 with palpitations much improved since addition of Toprol 25 mg daily.  She was recommended for annual follow-up.  She presents today for follow-up independently. Recently enjoyed a trip to [facility] for her anniversary.   She notes some more swelling in her feet and legs. It is better in the morning and worse in the evening. Swelling is even bilaterally. Some swelling in her bilateral hands. No new dyspnea, orthopnea, PND, chest pain. Discussed venous insufficiency. Walking regularly for exercise. Interested in more weight bearing exercise. BP has been well controlled at home.   ROS: Please see the history of present illness.    All other systems reviewed and are negative.   Studies Reviewed: Marland Kitchen       EKG Interpretation  Date/Time:  Monday June 15 2023 09:23:01 EDT Ventricular Rate:  49 PR Interval:  124 QRS Duration: 90 QT Interval:  444 QTC Calculation: 401 R Axis:   65 Text Interpretation: Sinus bradycardia with no acute ST/T wave changes. When compared with ECG of 17-Jun-1999 11:47, No significant change was found Confirmed by Gillian Shields ([id number]) on 06/15/2023 9:37:53 AM     Cardiac Studies & Procedures         MONITORS  LONG TERM MONITOR (3-14 DAYS) 07/11/2021  Narrative  Normal sinus rhythm  Rare isolated PACs and PVCs  Multiple episodes of nonsustained PAT longest 11 seconds.  Symptoms do not consistently correlate with arrhythmia   Patch Wear Time:  13 days and 23 hours (2022-06-30T11:19:29-0400 to 2022-07-14T10:45:28-0400)  Patient had a min HR of 49 bpm, max HR of 207 bpm, and avg HR of 72 bpm. Predominant underlying rhythm was Sinus Rhythm. 144 Supraventricular Tachycardia runs occurred, the run with the fastest interval lasting 7.5 secs with a max rate of 207 bpm, the longest lasting 11.0 secs with an avg rate of 150 bpm. Some episodes of Supraventricular Tachycardia may be possible Atrial Tachycardia with variable block. Isolated SVEs were rare (<1.0%), SVE Couplets were rare (<1.0%), and SVE Triplets were rare (<1.0%). Isolated VEs were rare (<1.0%, 1101), VE Couplets were rare (<1.0%, 1), and VE Triplets were rare (<1.0%, 1).           Risk Assessment/Calculations:             Physical Exam:   VS:  BP 118/76 (BP Location: Left Arm, Patient Position: Sitting, Cuff Size: Normal)   Pulse (!) 49   Ht 5\' 2"  (1.575 m)   Wt 108 lb 9.6 oz (49.3 kg)   BMI 19.86 kg/m    Wt Readings from Last 3 Encounters:  06/15/23 108 lb 9.6 oz (49.3 kg)  11/28/22 101 lb (45.8 kg)  11/27/21 105 lb (47.6  kg)    GEN: Well nourished, well developed in no acute distress NECK: No JVD; No carotid bruits CARDIAC: RRR, no murmurs, rubs, gallops RESPIRATORY:  Clear to auscultation without rales, wheezing or rhonchi  ABDOMEN: Soft, non-tender, non-distended EXTREMITIES:  No edema; No deformity   ASSESSMENT AND PLAN: .    Palpitations/PAT - Well controlled on Toprol 25mg  every day, continue same. Refills provided.   LE edema / Venous insufficiency - Reports bilateral LE edema particularly in the heat worse by end of day. No dyspnea, orthopnea suggestive of  heart failure. Likely etiology venous insufficiency. Leg elevation, compression socks (particularly for upcoming airline travel), and low sodium diet encouraged.   Hypertension - BP well controlled. Continue current antihypertensive regimen.   Refer to PREP exercise program.   Hyperlipidemia - managed with diet and exercise.  Hypothyroidism - Continue to follow with PCP.        Dispo: follow up in 1 year with Dr. Swaziland or APP  Signed, Alver Sorrow, NP

## 2023-06-19 ENCOUNTER — Telehealth: Payer: Self-pay

## 2023-06-19 NOTE — Telephone Encounter (Signed)
Called to discuss PREP program referral, she would like to attend next August class at [facility]; will contact her mid August to confirm and set up assessment visit

## 2023-08-07 ENCOUNTER — Telehealth: Payer: Self-pay

## 2023-08-07 NOTE — Telephone Encounter (Signed)
Called to confirm participation in next PREP class at [facility], left voicemail requesting return call.

## 2023-09-02 ENCOUNTER — Telehealth: Payer: Self-pay

## 2023-09-02 NOTE — Telephone Encounter (Signed)
Called to confirm participation in next PREP class at [facility]; left voicemail requesting return call.

## 2023-09-02 NOTE — Telephone Encounter (Signed)
She returned my call, they have some trips scheduled this month, prefers to wait until mid-October to start; will contact next month to confirm dates/times and schedule assessment visit

## 2023-09-23 ENCOUNTER — Telehealth: Payer: Self-pay

## 2023-09-23 NOTE — Telephone Encounter (Signed)
Called to discuss Oct PREP class scheduled, she would like to attend next class on Oct 14, every M/W at 12:30; assessment visit scheduled for Oct 9 at 12 noon.

## 2023-09-28 NOTE — Progress Notes (Signed)
[facility] PREP Evaluation  Patient Details  Name: Nicole Myers MRN: [id number] Date of Birth: [birth year] Age: 72 y.o. PCP: Wilfrid Lund, PA  Vitals:   09/28/23 1536  BP: 128/80  Pulse: (!) 49  SpO2: 98%  Weight: 109 lb 9.6 oz (49.7 kg)     [facility] Eval - 09/28/23 1500       [facility] "PREP" Location   [facility] "PREP" Location [facility]      Referral    Referring Provider Dan Humphreys    Reason for referral Hypertension    Program Start Date 10/05/23      Measurement   Waist Circumference 30 inches    Hip Circumference 36 inches    Body fat 37.2 percent      Information for Trainer   Goals --   Establish strength training routine/program; increase nutrition knowledge to include learning about inflammatory foods.   Current Exercise walking    Pertinent Medical History --   HTN, RLS, PAT   Medications that affect exercise Beta blocker      Timed Up and Go (TUGS)   Timed Up and Go Low risk <9 seconds      Mobility and Daily Activities   I find it easy to walk up or down two or more flights of stairs. 3    I have no trouble taking out the trash. 4    I do housework such as vacuuming and dusting on my own without difficulty. 4    I can easily lift a gallon of milk (8lbs). 4    I can easily walk a mile. 4    I have no trouble reaching into high cupboards or reaching down to pick up something from the floor. 4    I do not have trouble doing out-door work such as Loss adjuster, chartered, raking leaves, or gardening. 4      Mobility and Daily Activities   I feel younger than my age. 4    I feel independent. 4    I feel energetic. 3    I live an active life.  4    I feel strong. 4    I feel healthy. 4    I feel active as other people my age. 3      How fit and strong are you.   Fit and Strong Total Score 53            Past Medical History:  Diagnosis Date   Hives    Hypertension    Irregular heart beat    Thyroid disease    Past Surgical History:  Procedure Laterality  Date   BREAST EXCISIONAL BIOPSY Right    BREAST SURGERY Right 1985   CESAREAN SECTION     x2   HERNIA REPAIR     Social History   Tobacco Use  Smoking Status Never  Smokeless Tobacco Never  To begin PREP class at [facility] starting October 14, every M/W 12:30-1:45  Sonia Baller 09/28/2023, 3:39 PM

## 2023-10-05 NOTE — Progress Notes (Signed)
[facility] PREP Weekly Session  Patient Details  Name: Nicole Myers MRN: [id number] Date of Birth: 27-Jul-1951 Age: 72 y.o. PCP: Wilfrid Lund, PA  There were no vitals filed for this visit.   [facility] Weekly seesion - 10/05/23 1400       [facility] "PREP" Location   [facility] "PREP" Location [facility]      Weekly Session   Topic Discussed Goal setting and welcome to the program   Introductions, review of notebook, tour of facility; offer to work out on cardio machine   Classes attended to date 1             Nicole Myers 10/05/2023, 2:59 PM

## 2023-10-09 ENCOUNTER — Telehealth (HOSPITAL_BASED_OUTPATIENT_CLINIC_OR_DEPARTMENT_OTHER): Payer: Self-pay | Admitting: Family

## 2023-10-09 NOTE — Telephone Encounter (Signed)
Pt c/o BP issue: STAT if pt c/o blurred vision, one-sided weakness or slurred speech  1. What are your last 5 BP readings?   142/84 (last night) 110/66 (this morning) 99/66  2. Are you having any other symptoms (ex. Dizziness, headache, blurred vision, passed out)?   Headache  3. What is your BP issue?    Patient stated she had an intense headache on her right side in the temple area that went down to her cheek.

## 2023-10-09 NOTE — Telephone Encounter (Signed)
Returned call to patient,   Patient states she had a slight headache before she went to bed last night and woke up with an extreme headache in her right temple area and lower cheek area. She states this lasted quite a while, took 3 aspirin and it did help. Her blood pressure was quite elevated but she was in pain. She is wanting to know if she can be seen today or get some advice. She did have a migraine of her eye yesterday.   She does check her blood pressure regularly, she states it usually runs about 132/77, she states it has been up and down more recently, 153/82 123/72 112/8.   Advised patient to monitor BP for one week and call back with an update to determine if medication adjustments needed.

## 2023-10-12 NOTE — Progress Notes (Signed)
[facility] PREP Weekly Session  Patient Details  Name: Nicole Myers MRN: [id number] Date of Birth: Aug 12, 1951 Age: 72 y.o. PCP: Wilfrid Lund, PA  Vitals:   10/12/23 1359  Weight: 109 lb (49.4 kg)     [facility] Weekly seesion - 10/12/23 1300       [facility] "PREP" Location   [facility] "PREP" Location [facility]      Weekly Session   Topic Discussed Importance of resistance training;Other ways to be active   Goals: work up to 150 minutes/wk of cardio, strength training twice/wk, work up to 3 times/wk for 20-40 minutes; sitting no longer than 30 minutes during the day.   Classes attended to date 3             Mervyn Gay B Yassir Enis 10/12/2023, 1:59 PM

## 2023-10-19 NOTE — Progress Notes (Signed)
[facility] PREP Weekly Session  Patient Details  Name: Nicole Myers MRN: [id number] Date of Birth: [birth year] Age: 72 y.o. PCP: Wilfrid Lund, PA  Vitals:   10/19/23 1633  Weight: 108 lb 3.2 oz (49.1 kg)     [facility] Weekly seesion - 10/19/23 1600       [facility] "PREP" Location   [facility] "PREP" Location [facility]      Weekly Session   Topic Discussed Healthy eating tips   Foods to reduce, foods to increase; eat the rainbow of colors; introduced to [employer] app   Minutes exercised this week 270 minutes    Classes attended to date 5             Sonia Baller 10/19/2023, 4:33 PM

## 2023-10-26 NOTE — Progress Notes (Signed)
[facility] PREP Weekly Session  Patient Details  Name: Nicole Myers MRN: [id number] Date of Birth: December 26, 1950 Age: 72 y.o. PCP: Wilfrid Lund, PA  Vitals:   10/26/23 1353  Weight: 108 lb (49 kg)     [facility] Weekly seesion - 10/26/23 1300       [facility] "PREP" Location   [facility] "PREP" Location [facility]      Weekly Session   Topic Discussed Health habits   Sugar demo: limit added sugars to 24gm for women, 36 gm for men   Minutes exercised this week 270 minutes    Classes attended to date 7             Nicole Myers 10/26/2023, 1:54 PM

## 2023-10-28 ENCOUNTER — Other Ambulatory Visit: Payer: Self-pay | Admitting: Family Medicine

## 2023-10-28 DIAGNOSIS — Z1231 Encounter for screening mammogram for malignant neoplasm of breast: Secondary | ICD-10-CM

## 2023-10-29 DIAGNOSIS — R399 Unspecified symptoms and signs involving the genitourinary system: Secondary | ICD-10-CM | POA: Diagnosis not present

## 2023-10-29 DIAGNOSIS — Z6820 Body mass index (BMI) 20.0-20.9, adult: Secondary | ICD-10-CM | POA: Diagnosis not present

## 2023-11-02 NOTE — Progress Notes (Signed)
[facility] PREP Weekly Session  Patient Details  Name: Nicole Myers MRN: [id number] Date of Birth: 04/17/1951 Age: 72 y.o. PCP: Wilfrid Lund, PA  Vitals:   11/02/23 1416  Weight: 107 lb 3.2 oz (48.6 kg)     [facility] Weekly seesion - 11/02/23 1400       [facility] "PREP" Location   [facility] "PREP" Location [facility]      Weekly Session   Topic Discussed Restaurant Eating   Salt demo: limit intake to 1500-2300 mg/day   Minutes exercised this week 240 minutes    Classes attended to date 86             Kerri-Anne Haeberle B Fenna Semel 11/02/2023, 2:16 PM

## 2023-11-10 DIAGNOSIS — N952 Postmenopausal atrophic vaginitis: Secondary | ICD-10-CM | POA: Diagnosis not present

## 2023-11-10 DIAGNOSIS — N3021 Other chronic cystitis with hematuria: Secondary | ICD-10-CM | POA: Diagnosis not present

## 2023-11-10 DIAGNOSIS — R3982 Chronic bladder pain: Secondary | ICD-10-CM | POA: Diagnosis not present

## 2023-11-16 ENCOUNTER — Ambulatory Visit
Admission: RE | Admit: 2023-11-16 | Discharge: 2023-11-16 | Disposition: A | Discharge status: Home / Self Care | Payer: PPO | Source: Ambulatory Visit | Attending: Family Medicine | Admitting: Family Medicine

## 2023-11-16 DIAGNOSIS — Z1231 Encounter for screening mammogram for malignant neoplasm of breast: Secondary | ICD-10-CM | POA: Diagnosis not present

## 2023-11-16 NOTE — Progress Notes (Signed)
[facility] PREP Weekly Session  Patient Details  Name: Nicole Myers MRN: [id number] Date of Birth: 04-22-1951 Age: 72 y.o. PCP: Wilfrid Lund, PA  Vitals:   11/16/23 1412  Weight: 106 lb 12.8 oz (48.4 kg)     [facility] Weekly seesion - 11/16/23 1400       [facility] "PREP" Location   [facility] "PREP" Location [facility]      Weekly Session   Topic Discussed Stress management and problem solving   Importance of sleep---7-9 hours/night; finger tip mudra breathwork; baby to bed concept; OSA and CPAP use   Minutes exercised this week 265 minutes    Classes attended to date 35             Kotaro Buer B Lezette Kitts 11/16/2023, 2:14 PM

## 2023-11-23 NOTE — Progress Notes (Signed)
[facility] PREP Weekly Session  Patient Details  Name: Nicole Myers MRN: [id number] Date of Birth: [birth year] Age: 73 y.o. PCP: Wilfrid Lund, PA  Vitals:   11/23/23 1332  Weight: 107 lb (48.5 kg)     [facility] Weekly seesion - 11/23/23 1300       [facility] "PREP" Location   [facility] "PREP" Location [facility]      Weekly Session   Topic Discussed Expectations and non-scale victories   Halfway through program, review/revisit/re-state goals; Staying positive   Minutes exercised this week 150 minutes    Classes attended to date 43             Darby Shadwick B Avriel Kandel 11/23/2023, 1:34 PM

## 2023-11-24 DIAGNOSIS — N3021 Other chronic cystitis with hematuria: Secondary | ICD-10-CM | POA: Diagnosis not present

## 2023-11-25 DIAGNOSIS — Z90710 Acquired absence of both cervix and uterus: Secondary | ICD-10-CM | POA: Diagnosis not present

## 2023-11-25 DIAGNOSIS — K760 Fatty (change of) liver, not elsewhere classified: Secondary | ICD-10-CM | POA: Diagnosis not present

## 2023-11-25 DIAGNOSIS — K429 Umbilical hernia without obstruction or gangrene: Secondary | ICD-10-CM | POA: Diagnosis not present

## 2023-11-25 DIAGNOSIS — R310 Gross hematuria: Secondary | ICD-10-CM | POA: Diagnosis not present

## 2023-11-27 DIAGNOSIS — M810 Age-related osteoporosis without current pathological fracture: Secondary | ICD-10-CM | POA: Diagnosis not present

## 2023-11-27 DIAGNOSIS — R002 Palpitations: Secondary | ICD-10-CM | POA: Diagnosis not present

## 2023-11-27 DIAGNOSIS — G2581 Restless legs syndrome: Secondary | ICD-10-CM | POA: Diagnosis not present

## 2023-11-27 DIAGNOSIS — E039 Hypothyroidism, unspecified: Secondary | ICD-10-CM | POA: Diagnosis not present

## 2023-11-27 DIAGNOSIS — I10 Essential (primary) hypertension: Secondary | ICD-10-CM | POA: Diagnosis not present

## 2023-11-30 NOTE — Progress Notes (Signed)
[facility] PREP Weekly Session  Patient Details  Name: Nicole Myers MRN: [id number] Date of Birth: [birth year] Age: 72 y.o. PCP: Wilfrid Lund, PA  Vitals:   11/30/23 1333  Weight: 107 lb 6.4 oz (48.7 kg)     [facility] Weekly seesion - 11/30/23 1300       [facility] "PREP" Location   [facility] "PREP" Location [facility]      Weekly Session   Topic Discussed Other   Portion control; visualize your portion size demo; review of [employer] nutrition/food label   Minutes exercised this week 270 minutes    Classes attended to date 16             Tilman Mcclaren B Dewayne Severe 11/30/2023, 1:34 PM

## 2023-12-07 NOTE — Progress Notes (Signed)
[facility] PREP Weekly Session  Patient Details  Name: Nicole Myers MRN: [id number] Date of Birth: 11/27/1951 Age: 72 y.o. PCP: Wilfrid Lund, PA  Vitals:   12/07/23 1348  Weight: 106 lb (48.1 kg)     [facility] Weekly seesion - 12/07/23 1300       [facility] "PREP" Location   [facility] "PREP" Location [facility]      Weekly Session   Topic Discussed Finding support   Membership talk with Weston Brass; Emmi programming educational information after program ends   Minutes exercised this week 235 minutes    Classes attended to date 84             Valley Ke B Julyssa Kyer 12/07/2023, 1:49 PM

## 2023-12-08 DIAGNOSIS — R3982 Chronic bladder pain: Secondary | ICD-10-CM | POA: Diagnosis not present

## 2023-12-08 DIAGNOSIS — N952 Postmenopausal atrophic vaginitis: Secondary | ICD-10-CM | POA: Diagnosis not present

## 2023-12-08 DIAGNOSIS — N3592 Unspecified urethral stricture, female: Secondary | ICD-10-CM | POA: Diagnosis not present

## 2023-12-08 DIAGNOSIS — R310 Gross hematuria: Secondary | ICD-10-CM | POA: Diagnosis not present

## 2023-12-14 NOTE — Progress Notes (Signed)
[facility] PREP Weekly Session  Patient Details  Name: Nicole Myers MRN: [id number] Date of Birth: 1951/04/24 Age: 72 y.o. PCP: Wilfrid Lund, PA  Vitals:   12/14/23 1337  Weight: 107 lb 12.8 oz (48.9 kg)     [facility] Weekly seesion - 12/14/23 1300       [facility] "PREP" Location   [facility] "PREP" Location [facility]      Weekly Session   Topic Discussed Calorie breakdown   Carbs, proteins and fats; diff between simple and complex carbs; supplements, quality of macronutrients   Minutes exercised this week 210 minutes    Classes attended to date 24             Caedon Bond B Tex Conroy 12/14/2023, 1:41 PM

## 2023-12-28 NOTE — Progress Notes (Signed)
 [facility] PREP Weekly Session  Patient Details  Name: Nicole Myers MRN: [id number] Date of Birth: May 24, 1951 Age: 73 y.o. PCP: Alben Therisa MATSU, PA  Vitals:   12/28/23 1331  Weight: 107 lb 6.4 oz (48.7 kg)     [facility] Weekly seesion - 12/28/23 1300       [facility] PREP Location   [facility] PREP Location [facility]      Weekly Session   Topic Discussed Hitting roadblocks   fit testing completed; review of goals and activity plan for next 3 months; to bring that and completed PREP survey to final assessment visit next Monday 1/13   Minutes exercised this week 280 minutes    Classes attended to date 20             Nicole Myers Nicole Myers Nicole Myers 12/28/2023, 1:35 PM

## 2024-01-04 NOTE — Progress Notes (Signed)
 [facility] PREP Evaluation  Patient Details  Name: Nicole Myers MRN: [id number] Date of Birth: 1951/09/09 Age: 73 y.o. PCP: Alben Therisa MATSU, PA  Vitals:   01/04/24 1314  BP: 108/66  Pulse: (!) 57  SpO2: 99%  Weight: 108 lb 9.6 oz (49.3 kg)     [facility] Eval - 01/04/24 1300       [facility] PREP Location   [facility] PREP Location [facility]      Referral    Program Start Date 10/05/23    Program End Date 01/04/24      Measurement   Waist Circumference 30 inches    Waist Circumference End Program 28.5 inches    Hip Circumference 36 inches    Hip Circumference End Program 35.5 inches    Body fat 33.3 percent      Mobility and Daily Activities   I find it easy to walk up or down two or more flights of stairs. 4    I have no trouble taking out the trash. 4    I do housework such as vacuuming and dusting on my own without difficulty. 4    I can easily lift a gallon of milk (8lbs). 4    I can easily walk a mile. 4    I have no trouble reaching into high cupboards or reaching down to pick up something from the floor. 4    I do not have trouble doing out-door work such as loss adjuster, chartered, raking leaves, or gardening. 4      Mobility and Daily Activities   I feel younger than my age. 4    I feel independent. 4    I feel energetic. 4    I live an active life.  4    I feel strong. 3    I feel healthy. 4    I feel active as other people my age. 4      How fit and strong are you.   Fit and Strong Total Score 55            Past Medical History:  Diagnosis Date   Hives    Hypertension    Irregular heart beat    Thyroid  disease    Past Surgical History:  Procedure Laterality Date   BREAST EXCISIONAL BIOPSY Right    BREAST SURGERY Right 1985   CESAREAN SECTION     x2   HERNIA REPAIR     Social History   Tobacco Use  Smoking Status Never  Smokeless Tobacco Never  Wt loss: 1 lb Inches lost 2 How fit and strong survey: 09/28/23: 53 12/31/23: 55 Fit  testing: Cardio march improved from 289 to 319 Sit to stands improved from 13 to 21 Bicep curls improved from 17 to 23 Education sessions completed: 11 Workout sessions completed: 10   Andreana Klingerman B Berlie Hatchel 01/04/2024, 1:16 PM

## 2024-03-21 ENCOUNTER — Other Ambulatory Visit: Payer: Self-pay | Admitting: Urology

## 2024-03-21 DIAGNOSIS — R935 Abnormal findings on diagnostic imaging of other abdominal regions, including retroperitoneum: Secondary | ICD-10-CM

## 2024-04-13 DIAGNOSIS — G43B0 Ophthalmoplegic migraine, not intractable: Secondary | ICD-10-CM | POA: Diagnosis not present

## 2024-04-13 DIAGNOSIS — H04123 Dry eye syndrome of bilateral lacrimal glands: Secondary | ICD-10-CM | POA: Diagnosis not present

## 2024-04-13 DIAGNOSIS — H43813 Vitreous degeneration, bilateral: Secondary | ICD-10-CM | POA: Diagnosis not present

## 2024-04-30 ENCOUNTER — Ambulatory Visit
Admission: RE | Admit: 2024-04-30 | Discharge: 2024-04-30 | Disposition: A | Discharge status: Home / Self Care | Source: Ambulatory Visit | Attending: Urology | Admitting: Urology

## 2024-04-30 DIAGNOSIS — D7389 Other diseases of spleen: Secondary | ICD-10-CM | POA: Diagnosis not present

## 2024-04-30 DIAGNOSIS — R935 Abnormal findings on diagnostic imaging of other abdominal regions, including retroperitoneum: Secondary | ICD-10-CM

## 2024-04-30 MED ORDER — GADOPICLENOL 0.5 MMOL/ML IV SOLN
5.0000 mL | Freq: Once | INTRAVENOUS | Status: AC | PRN
  Administered 2024-04-30: 5 mL via INTRAVENOUS

## 2024-05-11 DIAGNOSIS — M810 Age-related osteoporosis without current pathological fracture: Secondary | ICD-10-CM | POA: Diagnosis not present

## 2024-05-11 DIAGNOSIS — G2581 Restless legs syndrome: Secondary | ICD-10-CM | POA: Diagnosis not present

## 2024-05-11 DIAGNOSIS — Z0000 Encounter for general adult medical examination without abnormal findings: Secondary | ICD-10-CM | POA: Diagnosis not present

## 2024-05-11 DIAGNOSIS — Z1331 Encounter for screening for depression: Secondary | ICD-10-CM | POA: Diagnosis not present

## 2024-05-11 DIAGNOSIS — E039 Hypothyroidism, unspecified: Secondary | ICD-10-CM | POA: Diagnosis not present

## 2024-05-11 DIAGNOSIS — G44209 Tension-type headache, unspecified, not intractable: Secondary | ICD-10-CM | POA: Diagnosis not present

## 2024-05-11 DIAGNOSIS — R002 Palpitations: Secondary | ICD-10-CM | POA: Diagnosis not present

## 2024-05-11 DIAGNOSIS — I10 Essential (primary) hypertension: Secondary | ICD-10-CM | POA: Diagnosis not present

## 2024-05-11 DIAGNOSIS — N952 Postmenopausal atrophic vaginitis: Secondary | ICD-10-CM | POA: Diagnosis not present

## 2024-05-17 DIAGNOSIS — I10 Essential (primary) hypertension: Secondary | ICD-10-CM | POA: Diagnosis not present

## 2024-05-21 DIAGNOSIS — M810 Age-related osteoporosis without current pathological fracture: Secondary | ICD-10-CM | POA: Diagnosis not present

## 2024-05-21 DIAGNOSIS — E039 Hypothyroidism, unspecified: Secondary | ICD-10-CM | POA: Diagnosis not present

## 2024-05-21 DIAGNOSIS — I10 Essential (primary) hypertension: Secondary | ICD-10-CM | POA: Diagnosis not present

## 2024-06-15 DIAGNOSIS — I10 Essential (primary) hypertension: Secondary | ICD-10-CM | POA: Diagnosis not present

## 2024-06-20 DIAGNOSIS — I10 Essential (primary) hypertension: Secondary | ICD-10-CM | POA: Diagnosis not present

## 2024-06-23 ENCOUNTER — Other Ambulatory Visit (HOSPITAL_BASED_OUTPATIENT_CLINIC_OR_DEPARTMENT_OTHER): Payer: Self-pay | Admitting: Family

## 2024-06-23 DIAGNOSIS — I4719 Other supraventricular tachycardia: Secondary | ICD-10-CM

## 2024-06-23 DIAGNOSIS — S80862A Insect bite (nonvenomous), left lower leg, initial encounter: Secondary | ICD-10-CM | POA: Diagnosis not present

## 2024-06-23 DIAGNOSIS — Z91038 Other insect allergy status: Secondary | ICD-10-CM | POA: Diagnosis not present

## 2024-06-23 DIAGNOSIS — R002 Palpitations: Secondary | ICD-10-CM

## 2024-07-07 DIAGNOSIS — N3020 Other chronic cystitis without hematuria: Secondary | ICD-10-CM | POA: Diagnosis not present

## 2024-07-15 DIAGNOSIS — I10 Essential (primary) hypertension: Secondary | ICD-10-CM | POA: Diagnosis not present

## 2024-07-21 DIAGNOSIS — E039 Hypothyroidism, unspecified: Secondary | ICD-10-CM | POA: Diagnosis not present

## 2024-07-21 DIAGNOSIS — M810 Age-related osteoporosis without current pathological fracture: Secondary | ICD-10-CM | POA: Diagnosis not present

## 2024-07-21 DIAGNOSIS — I10 Essential (primary) hypertension: Secondary | ICD-10-CM | POA: Diagnosis not present

## 2024-07-22 ENCOUNTER — Other Ambulatory Visit (HOSPITAL_BASED_OUTPATIENT_CLINIC_OR_DEPARTMENT_OTHER): Payer: Self-pay | Admitting: Family

## 2024-07-22 DIAGNOSIS — I4719 Other supraventricular tachycardia: Secondary | ICD-10-CM

## 2024-07-22 DIAGNOSIS — R002 Palpitations: Secondary | ICD-10-CM

## 2024-07-25 ENCOUNTER — Telehealth: Payer: Self-pay | Admitting: Cardiology

## 2024-07-25 NOTE — Telephone Encounter (Signed)
*  STAT* If patient is at the pharmacy, call can be transferred to refill team.   1. Which medications need to be refilled? (please list name of each medication and dose if known) metoprolol  succinate (TOPROL -XL) 25 MG 24 hr tablet   2. Which pharmacy/location (including street and city if local pharmacy) is medication to be sent to? [id number] - [place], [place] - [address]   3. Do they need a 30 day or 90 day supply?  90 day supply

## 2024-07-26 ENCOUNTER — Other Ambulatory Visit: Payer: Self-pay

## 2024-07-26 ENCOUNTER — Other Ambulatory Visit (HOSPITAL_BASED_OUTPATIENT_CLINIC_OR_DEPARTMENT_OTHER): Payer: Self-pay | Admitting: Family

## 2024-07-26 DIAGNOSIS — I4719 Other supraventricular tachycardia: Secondary | ICD-10-CM

## 2024-07-26 DIAGNOSIS — R002 Palpitations: Secondary | ICD-10-CM

## 2024-07-26 MED ORDER — METOPROLOL SUCCINATE ER 25 MG PO TB24: 25.0000 mg | ORAL_TABLET | Freq: Every day | ORAL | 0 refills | Status: DC

## 2024-08-01 DIAGNOSIS — L309 Dermatitis, unspecified: Secondary | ICD-10-CM | POA: Diagnosis not present

## 2024-08-01 DIAGNOSIS — L820 Inflamed seborrheic keratosis: Secondary | ICD-10-CM | POA: Diagnosis not present

## 2024-08-01 DIAGNOSIS — L218 Other seborrheic dermatitis: Secondary | ICD-10-CM | POA: Diagnosis not present

## 2024-08-01 DIAGNOSIS — D23111 Other benign neoplasm of skin of right upper eyelid, including canthus: Secondary | ICD-10-CM | POA: Diagnosis not present

## 2024-08-14 DIAGNOSIS — I10 Essential (primary) hypertension: Secondary | ICD-10-CM | POA: Diagnosis not present

## 2024-08-21 DIAGNOSIS — M810 Age-related osteoporosis without current pathological fracture: Secondary | ICD-10-CM | POA: Diagnosis not present

## 2024-08-21 DIAGNOSIS — I10 Essential (primary) hypertension: Secondary | ICD-10-CM | POA: Diagnosis not present

## 2024-08-21 DIAGNOSIS — E039 Hypothyroidism, unspecified: Secondary | ICD-10-CM | POA: Diagnosis not present

## 2024-08-24 DIAGNOSIS — R001 Bradycardia, unspecified: Secondary | ICD-10-CM | POA: Diagnosis not present

## 2024-08-24 DIAGNOSIS — I959 Hypotension, unspecified: Secondary | ICD-10-CM | POA: Diagnosis not present

## 2024-08-30 DIAGNOSIS — M545 Low back pain, unspecified: Secondary | ICD-10-CM | POA: Diagnosis not present

## 2024-09-02 NOTE — Progress Notes (Signed)
 Cardiology Office Note:  .   Date:  09/08/2024  ID:  Nicole Myers, DOB May 21, 1951, MRN [id number] PCP: Nicole Therisa MATSU, PA  [facility] Providers Cardiologist:  Nicole Pizzolato Sw[place], MD    History of Present Illness: .   Nicole Myers is a 73 y.o. female with history of palpitations, PAT, hypertension, osteoporosis, hypercalcemia, hypothyroidism.    Contacted the office 05/2021 noting palpitations with repeat 14-day ZIO monitor with multiple episodes of PAT with longest 11 seconds and at follow-up 07/2021 Toprol  was initiated. When seen later symptoms improved.   On follow up today she denies any palpitations. Notes occasional low BP on cuff at home. Pulse is in 50s. No tachycardia. Has restless legs.    ROS: Please see the history of present illness.    All other systems reviewed and are negative.   Studies Reviewed: Nicole Myers   EKG Interpretation Date/Time:  Thursday September 08 2024 16:51:55 EDT Ventricular Rate:  46 PR Interval:  114 QRS Duration:  92 QT Interval:  428 QTC Calculation: 374 R Axis:   74  Text Interpretation: Sinus bradycardia Possible Left atrial enlargement When compared with ECG of 15-Jun-2023 09:23, No significant change was found Confirmed by Myers, Nicole Melott [phone]) on 09/08/2024 5:12:50 PM   EKG Interpretation Date/Time:  Thursday September 08 2024 16:51:55 EDT Ventricular Rate:  46 PR Interval:  114 QRS Duration:  92 QT Interval:  428 QTC Calculation: 374 R Axis:   74  Text Interpretation: Sinus bradycardia Possible Left atrial enlargement When compared with ECG of 15-Jun-2023 09:23, No significant change was found Confirmed by Myers, Akaysha Cobern [phone]) on 09/08/2024 5:12:50 PM  EKG Interpretation Date/Time:  Thursday September 08 2024 16:51:55 EDT Ventricular Rate:  46 PR Interval:  114 QRS Duration:  92 QT Interval:  428 QTC Calculation: 374 R Axis:   74  Text Interpretation: Sinus bradycardia Possible Left atrial enlargement When compared with ECG  of 15-Jun-2023 09:23, No significant change was found Confirmed by Myers, Trell Secrist [phone]) on 09/08/2024 5:12:50 PM    Cardiac Studies & Procedures   ______________________________________________________________________________________________        Nicole Myers  LONG TERM MONITOR (3-14 DAYS) 07/10/2021  Narrative  Normal sinus rhythm  Rare isolated PACs and PVCs  Multiple episodes of nonsustained PAT longest 11 seconds.  Symptoms do not consistently correlate with arrhythmia   Patch Wear Time:  13 days and 23 hours (2022-06-30T11:19:29-0400 to 2022-07-14T10:45:28-0400)  Patient had a min HR of 49 bpm, max HR of 207 bpm, and avg HR of 72 bpm. Predominant underlying rhythm was Sinus Rhythm. 144 Supraventricular Tachycardia runs occurred, the run with the fastest interval lasting 7.5 secs with a max rate of 207 bpm, the longest lasting 11.0 secs with an avg rate of 150 bpm. Some episodes of Supraventricular Tachycardia may be possible Atrial Tachycardia with variable block. Isolated SVEs were rare (<1.0%), SVE Couplets were rare (<1.0%), and SVE Triplets were rare (<1.0%). Isolated VEs were rare (<1.0%, 1101), VE Couplets were rare (<1.0%, 1), and VE Triplets were rare (<1.0%, 1).       ______________________________________________________________________________________________      Risk Assessment/Calculations:             Physical Exam:   VS:  BP 130/74   Pulse (!) 46   Ht 5' 1 (1.549 m)   Wt 108 lb (49 kg)   SpO2 97%   BMI 20.41 kg/m    Wt Readings from Last 3 Encounters:  09/08/24 108 lb (49 kg)  01/04/24 108 lb 9.6 oz (49.3 kg)  12/28/23 107 lb 6.4 oz (48.7 kg)    GEN: Well nourished, well developed in no acute distress NECK: No JVD; No carotid bruits CARDIAC: RRR, no murmurs, rubs, gallops RESPIRATORY:  Clear to auscultation without rales, wheezing or rhonchi  ABDOMEN: Soft, non-tender, non-distended EXTREMITIES:  No edema; No deformity   ASSESSMENT AND  PLAN: .    Palpitations/PAT - Well controlled on Toprol  25mg  every day. Concerned about slow pulse today. Will reduce Toprol  to 12.5 mg daily  Hypertension - BP well controlled. Continue current irbesartan  Hyperlipidemia - managed with diet and exercise.  Hypothyroidism - Continue to follow with PCP.        Dispo: follow up in 1 year with Dr. Swaziland or APP  Signed, Nicole Delashmit Sw[place], MD

## 2024-09-08 ENCOUNTER — Encounter: Payer: Self-pay | Admitting: Cardiology

## 2024-09-08 ENCOUNTER — Ambulatory Visit: Attending: Cardiology | Admitting: Cardiology

## 2024-09-08 VITALS — BP 130/74 | HR 46 | Ht 61.0 in | Wt 108.0 lb

## 2024-09-08 DIAGNOSIS — R011 Cardiac murmur, unspecified: Secondary | ICD-10-CM | POA: Diagnosis not present

## 2024-09-08 DIAGNOSIS — I119 Hypertensive heart disease without heart failure: Secondary | ICD-10-CM

## 2024-09-08 DIAGNOSIS — R002 Palpitations: Secondary | ICD-10-CM | POA: Diagnosis not present

## 2024-09-08 DIAGNOSIS — I4719 Other supraventricular tachycardia: Secondary | ICD-10-CM

## 2024-09-08 MED ORDER — METOPROLOL SUCCINATE ER 25 MG PO TB24
12.5000 mg | ORAL_TABLET | Freq: Every day | ORAL | 3 refills | Status: AC
Start: 2024-09-08 — End: ?

## 2024-09-08 NOTE — Patient Instructions (Signed)
 Medication Instructions:  Decrease Toprol  to 12.5 mg daily Continue all other medications *If you need a refill on your cardiac medications before your next appointment, please call your pharmacy*  Lab Work: None ordered  Testing/Procedures: None ordered  Follow-Up: At [facility], you and your health needs are our priority.  As part of our continuing mission to provide you with exceptional heart care, our providers are all part of one team.  This team includes your primary Cardiologist (physician) and Advanced Practice Providers or APPs (Physician Assistants and Nurse Practitioners) who all work together to provide you with the care you need, when you need it.  Your next appointment:  1 year   Call in May to schedule Sept appointment     Provider:  Dr.Jordan   We recommend signing up for the patient portal called MyChart.  Sign up information is provided on this After Visit Summary.  MyChart is used to connect with patients for Virtual Visits (Telemedicine).  Patients are able to view lab/test results, encounter notes, upcoming appointments, etc.  Non-urgent messages can be sent to your provider as well.   To learn more about what you can do with MyChart, go to [email or web address].

## 2024-09-13 DIAGNOSIS — I10 Essential (primary) hypertension: Secondary | ICD-10-CM | POA: Diagnosis not present

## 2024-09-20 DIAGNOSIS — M810 Age-related osteoporosis without current pathological fracture: Secondary | ICD-10-CM | POA: Diagnosis not present

## 2024-09-20 DIAGNOSIS — I10 Essential (primary) hypertension: Secondary | ICD-10-CM | POA: Diagnosis not present

## 2024-09-20 DIAGNOSIS — E039 Hypothyroidism, unspecified: Secondary | ICD-10-CM | POA: Diagnosis not present

## 2024-09-28 ENCOUNTER — Ambulatory Visit: Admitting: Cardiology

## 2024-10-03 ENCOUNTER — Other Ambulatory Visit: Payer: Self-pay | Admitting: Family Medicine

## 2024-10-03 DIAGNOSIS — Z1231 Encounter for screening mammogram for malignant neoplasm of breast: Secondary | ICD-10-CM

## 2024-10-13 DIAGNOSIS — I10 Essential (primary) hypertension: Secondary | ICD-10-CM | POA: Diagnosis not present

## 2024-10-21 DIAGNOSIS — I10 Essential (primary) hypertension: Secondary | ICD-10-CM | POA: Diagnosis not present

## 2024-10-21 DIAGNOSIS — E039 Hypothyroidism, unspecified: Secondary | ICD-10-CM | POA: Diagnosis not present

## 2024-10-21 DIAGNOSIS — M810 Age-related osteoporosis without current pathological fracture: Secondary | ICD-10-CM | POA: Diagnosis not present

## 2024-10-26 ENCOUNTER — Other Ambulatory Visit: Payer: Self-pay | Admitting: Cardiology

## 2024-10-26 DIAGNOSIS — I4719 Other supraventricular tachycardia: Secondary | ICD-10-CM

## 2024-10-26 DIAGNOSIS — R002 Palpitations: Secondary | ICD-10-CM

## 2024-11-16 ENCOUNTER — Ambulatory Visit
Admission: RE | Admit: 2024-11-16 | Discharge: 2024-11-16 | Disposition: A | Discharge status: Home / Self Care | Source: Ambulatory Visit | Attending: Family Medicine | Admitting: Family Medicine

## 2024-11-16 DIAGNOSIS — Z1231 Encounter for screening mammogram for malignant neoplasm of breast: Secondary | ICD-10-CM | POA: Diagnosis not present

## 2024-11-25 ENCOUNTER — Other Ambulatory Visit (HOSPITAL_BASED_OUTPATIENT_CLINIC_OR_DEPARTMENT_OTHER): Payer: Self-pay | Admitting: Family Medicine

## 2024-11-25 DIAGNOSIS — N952 Postmenopausal atrophic vaginitis: Secondary | ICD-10-CM | POA: Diagnosis not present

## 2024-11-25 DIAGNOSIS — E2839 Other primary ovarian failure: Secondary | ICD-10-CM

## 2024-11-25 DIAGNOSIS — G2581 Restless legs syndrome: Secondary | ICD-10-CM | POA: Diagnosis not present

## 2024-11-25 DIAGNOSIS — I10 Essential (primary) hypertension: Secondary | ICD-10-CM | POA: Diagnosis not present

## 2024-11-25 DIAGNOSIS — G44209 Tension-type headache, unspecified, not intractable: Secondary | ICD-10-CM | POA: Diagnosis not present

## 2024-11-25 DIAGNOSIS — M810 Age-related osteoporosis without current pathological fracture: Secondary | ICD-10-CM | POA: Diagnosis not present

## 2024-11-25 DIAGNOSIS — R002 Palpitations: Secondary | ICD-10-CM | POA: Diagnosis not present

## 2024-11-25 DIAGNOSIS — M419 Scoliosis, unspecified: Secondary | ICD-10-CM | POA: Diagnosis not present

## 2024-11-25 DIAGNOSIS — M51361 Other intervertebral disc degeneration, lumbar region with lower extremity pain only: Secondary | ICD-10-CM | POA: Diagnosis not present

## 2024-11-25 DIAGNOSIS — E039 Hypothyroidism, unspecified: Secondary | ICD-10-CM | POA: Diagnosis not present

## 2024-12-27 ENCOUNTER — Ambulatory Visit (HOSPITAL_BASED_OUTPATIENT_CLINIC_OR_DEPARTMENT_OTHER)
Admission: RE | Admit: 2024-12-27 | Discharge: 2024-12-27 | Disposition: A | Discharge status: Home / Self Care | Source: Ambulatory Visit | Attending: Family Medicine | Admitting: Family Medicine

## 2024-12-27 DIAGNOSIS — E2839 Other primary ovarian failure: Secondary | ICD-10-CM | POA: Diagnosis present

## 2025-01-17 ENCOUNTER — Emergency Department (HOSPITAL_BASED_OUTPATIENT_CLINIC_OR_DEPARTMENT_OTHER)
Admission: EM | Admit: 2025-01-17 | Discharge: 2025-01-17 | Disposition: A | Discharge status: Home / Self Care | Attending: Emergency Medicine | Admitting: Emergency Medicine

## 2025-01-17 ENCOUNTER — Encounter (HOSPITAL_BASED_OUTPATIENT_CLINIC_OR_DEPARTMENT_OTHER): Payer: Self-pay

## 2025-01-17 ENCOUNTER — Emergency Department (HOSPITAL_BASED_OUTPATIENT_CLINIC_OR_DEPARTMENT_OTHER)

## 2025-01-17 ENCOUNTER — Other Ambulatory Visit: Payer: Self-pay

## 2025-01-17 DIAGNOSIS — W01198A Fall on same level from slipping, tripping and stumbling with subsequent striking against other object, initial encounter: Secondary | ICD-10-CM | POA: Diagnosis not present

## 2025-01-17 DIAGNOSIS — S0181XA Laceration without foreign body of other part of head, initial encounter: Secondary | ICD-10-CM | POA: Insufficient documentation

## 2025-01-17 DIAGNOSIS — S0990XA Unspecified injury of head, initial encounter: Secondary | ICD-10-CM | POA: Insufficient documentation

## 2025-01-17 NOTE — ED Triage Notes (Signed)
 States lost footing while putting wood in the stove. Hit L side of face. Lac and swelling noted to L cheek bone/beside eye  Headache. Relieved by ibuprofen at home  Denies NV, visual changes  No blood thinners, no LOC

## 2025-01-17 NOTE — ED Provider Notes (Signed)
 " [facility] Provider Note   CSN: [id number] Arrival date & time: 01/17/25  1639     Patient presents with: Nicole Myers Nicole Myers is a 74 y.o. female.   Patient presents to the emergency department today for evaluation of head injury.  Injury occurred around 4 PM.  Patient was bending over putting some wood in a wood stove when she lost her balance and fell forward striking her left orbital area on a door.  She sustained a laceration which initially bled a lot.  Bleeding has since stopped.  Patient denies loss of consciousness.  She is not on anticoagulation.  She had a headache after the injury, took ibuprofen which resolved the headache.  She denies neck pain.  No weakness, numbness or tingling in the arms or the legs.  No dental injury.  No subsequent confusion or vomiting.  No loss of vision.  Patient reports tetanus is up-to-date.       Prior to Admission medications  Medication Sig Start Date End Date Taking? Authorizing Provider  rOPINIRole  (REQUIP ) 0.5 MG tablet Take 0.5 mg by mouth every evening. 11/01/24  Yes [provider]  alendronate (FOSAMAX) 70 MG tablet Take 70 mg by mouth once a week.    [provider]  B Complex Vitamins (VITAMIN B COMPLEX) TABS daily at 6 (six) AM.    [provider]  betamethasone dipropionate 0.05 % lotion as needed (rash). 08/01/24   [provider]  calcium carbonate (SUPER CALCIUM) 1500 (600 Ca) MG TABS tablet daily with breakfast. 07/02/16   [provider]  calcium citrate-vitamin D (CITRACAL+D) 315-200 MG-UNIT per tablet Take 1 tablet by mouth 2 (two) times daily.      [provider]  cholecalciferol (VITAMIN D3) 25 MCG (1000 UNIT) tablet daily.    [provider]  clobetasol cream (TEMOVATE) 0.05 %  12/31/18   [provider]  diazepam (VALIUM) 10 MG tablet Take 10 mg by mouth at bedtime. 08/30/24   [provider]   fluocinonide (LIDEX) 0.05 % external solution  12/23/18   [provider]  hydrocortisone 2.5 % cream as needed (rash). 08/01/24   [provider]  irbesartan (AVAPRO) 150 MG tablet Take 150 mg by mouth daily. 10/13/21   [provider]  levothyroxine  (SYNTHROID ) 25 MCG tablet Take 25 mcg by mouth every morning. 10/24/21   [provider]  meloxicam (MOBIC) 15 MG tablet Take 15 mg by mouth daily.    [provider]  metoprolol  succinate (TOPROL -XL) 25 MG 24 hr tablet Take 0.5 tablets (12.5 mg total) by mouth daily. 09/08/24   Jordan, Peter M, MD  Multiple Vitamins-Minerals (MULTI FOR HER 50+) TABS daily at 6 (six) AM.    [provider]  naproxen sodium (ALEVE) 220 MG tablet as needed (pain). 05/23/15   [provider]  predniSONE (DELTASONE) 10 MG tablet Take 10 mg by mouth 3 (three) times daily. 08/30/24   [provider]    Allergies: Codeine and Olmesartan    Review of Systems  Updated Vital Signs BP (!) 156/109 (BP Location: Left Arm)   Pulse (!) 55   Temp 98.2 F (36.8 C) (Oral)   Resp 16   SpO2 99%   Physical Exam Vitals and nursing note reviewed.  Constitutional:      Appearance: She is well-developed.  HENT:     Head: Normocephalic. No raccoon eyes or Battle's sign.  Comments: Patient with a superficial, nongaping, 0.5 cm laceration lateral to the lateral canthus of the left eye.  This can be open with tension.  Wound base appears clean.  No active bleeding.    Right Ear: Tympanic membrane, ear canal and external ear normal. No hemotympanum.     Left Ear: Tympanic membrane, ear canal and external ear normal. No hemotympanum.     Nose: Nose normal.     Mouth/Throat:     Pharynx: Uvula midline.  Eyes:     General: Lids are normal.     Extraocular Movements:     Right eye: No nystagmus.     Left eye: No nystagmus.     Conjunctiva/sclera: Conjunctivae normal.     Pupils: Pupils are equal, round, and  reactive to light.     Comments: No visible hyphema noted  Cardiovascular:     Rate and Rhythm: Normal rate and regular rhythm.  Pulmonary:     Effort: Pulmonary effort is normal.     Breath sounds: Normal breath sounds.  Abdominal:     Palpations: Abdomen is soft.     Tenderness: There is no abdominal tenderness.  Musculoskeletal:     Cervical back: Normal range of motion and neck supple. No tenderness or bony tenderness. Normal range of motion.  Skin:    General: Skin is warm and dry.  Neurological:     Mental Status: She is alert and oriented to person, place, and time.     GCS: GCS eye subscore is 4. GCS verbal subscore is 5. GCS motor subscore is 6.     Cranial Nerves: No cranial nerve deficit.     Sensory: No sensory deficit.     Coordination: Coordination normal.     (all labs ordered are listed, but only abnormal results are displayed) Labs Reviewed - No data to display  EKG: None  Radiology: CT Head Wo Contrast Result Date: 01/17/2025 CLINICAL DATA:  Fall EXAM: CT HEAD WITHOUT CONTRAST CT MAXILLOFACIAL WITHOUT CONTRAST TECHNIQUE: Multidetector CT imaging of the head and maxillofacial structures were performed using the standard protocol without intravenous contrast. Multiplanar CT image reconstructions of the maxillofacial structures were also generated. RADIATION DOSE REDUCTION: This exam was performed according to the departmental dose-optimization program which includes automated exposure control, adjustment of the mA and/or kV according to patient size and/or use of iterative reconstruction technique. COMPARISON:  None Available. FINDINGS: CT HEAD FINDINGS Brain: No acute territorial infarction, hemorrhage or intracranial. The ventricles are nonenlarged. Vascular: No hyperdense vessels.  No unexpected calcification Skull: Normal. Negative for fracture or focal lesion. Other: None CT MAXILLOFACIAL FINDINGS Osseous: Mastoid air cells are clear. Mandibular heads are normally  positioned. No mandibular fracture. Pterygoid plates and zygomatic arches appear intact. No acute nasal bone fracture. Orbits: Negative. No traumatic or inflammatory finding. Sinuses: No fluid level. Mild mucosal thickening in the sinuses. No sinus wall fracture Soft tissues: Negative. IMPRESSION: 1. Negative non contrasted CT appearance of the brain for age. 2. No acute facial bone fracture. Electronically Signed   By: Luke Bun M.D.   On: 01/17/2025 18:15   CT Maxillofacial Wo Contrast Result Date: 01/17/2025 CLINICAL DATA:  Fall EXAM: CT HEAD WITHOUT CONTRAST CT MAXILLOFACIAL WITHOUT CONTRAST TECHNIQUE: Multidetector CT imaging of the head and maxillofacial structures were performed using the standard protocol without intravenous contrast. Multiplanar CT image reconstructions of the maxillofacial structures were also generated. RADIATION DOSE REDUCTION: This exam was performed according to the departmental dose-optimization program which includes  automated exposure control, adjustment of the mA and/or kV according to patient size and/or use of iterative reconstruction technique. COMPARISON:  None Available. FINDINGS: CT HEAD FINDINGS Brain: No acute territorial infarction, hemorrhage or intracranial. The ventricles are nonenlarged. Vascular: No hyperdense vessels.  No unexpected calcification Skull: Normal. Negative for fracture or focal lesion. Other: None CT MAXILLOFACIAL FINDINGS Osseous: Mastoid air cells are clear. Mandibular heads are normally positioned. No mandibular fracture. Pterygoid plates and zygomatic arches appear intact. No acute nasal bone fracture. Orbits: Negative. No traumatic or inflammatory finding. Sinuses: No fluid level. Mild mucosal thickening in the sinuses. No sinus wall fracture Soft tissues: Negative. IMPRESSION: 1. Negative non contrasted CT appearance of the brain for age. 2. No acute facial bone fracture. Electronically Signed   By: Luke Bun M.D.   On: 01/17/2025 18:15      .Laceration Repair  Date/Time: 01/17/2025 7:02 PM  Performed by: Desiderio Chew, PA-C Authorized by: Desiderio Chew, PA-C   Consent:    Consent obtained:  Verbal   Consent given by:  Patient   Risks discussed:  Infection and pain Universal protocol:    Patient identity confirmed:  Verbally with patient and provided demographic data Anesthesia:    Anesthesia method:  None Laceration details:    Length (cm):  0.5 Pre-procedure details:    Preparation:  Patient was prepped and draped in usual sterile fashion Treatment:    Area cleansed with:  Shur-Clens   Amount of cleaning:  Standard Skin repair:    Repair method:  Tissue adhesive Approximation:    Approximation:  Close Repair type:    Repair type:  Simple Post-procedure details:    Dressing:  Open (no dressing)   Procedure completion:  Tolerated well, no immediate complications    Medications Ordered in the ED - No data to display  ED Course  Patient seen and examined. History obtained directly from patient.   Labs/EKG: None ordered  Imaging: CT head and CT maxillofacial ordered in triage.  Confirmed no cervical spine pain or neurologic deficits.  Do not feel that patient needs CT imaging at this point of her cervical spine.  Medications/Fluids: Ordered Dermabond  Most recent vital signs reviewed and are as follows: BP (!) 156/109 (BP Location: Left Arm)   Pulse (!) 55   Temp 98.2 F (36.8 C) (Oral)   Resp 16   SpO2 99%   Initial impression: Patient with a fall and minor head injury with laceration.  Wound will be closed with Dermabond.  7:02 PM Reassessment performed. Patient appears stable and comfortable.  No decompensation.  Dermabond applied without any issues.  Wound closely approximated.  Imaging personally visualized and interpreted including: CT head and maxillofacial, agree negative for acute findings.  Reviewed pertinent lab work and imaging with patient at bedside. Questions answered.    Most current vital signs reviewed and are as follows: BP (!) 156/109 (BP Location: Left Arm)   Pulse (!) 55   Temp 98.2 F (36.8 C) (Oral)   Resp 16   SpO2 99%   Plan: Discharge to home.   Prescriptions written for: None  Other home care instructions discussed: Close monitoring of symptoms, OTC meds for headache and pain  ED return instructions discussed: Patient was counseled on head injury precautions and symptoms that should indicate their return to the ED.  These include severe worsening headache, vision changes, confusion, loss of consciousness, trouble walking, nausea & vomiting, or weakness/tingling in extremities.    Follow-up instructions discussed: Patient  encouraged to follow-up with their PCPas needed.                                   Medical Decision Making  Patient with mechanical fall, minor head injury.  CT imaging was negative.  Patient had a small well-approximated laceration which was closed with Dermabond.  Tetanus is up-to-date per patient report.  Patient will continue to monitor closely, return with any worsening.     Final diagnoses:  Minor head injury, initial encounter  Facial laceration, initial encounter    ED Discharge Orders     None          Desiderio Chew, PA-C 01/17/25 1904    Lenor Hollering, MD 01/17/25 2116  "

## 2025-01-17 NOTE — Discharge Instructions (Signed)
 Please read and follow all provided instructions.  Your diagnoses today include:  1. Minor head injury, initial encounter   2. Facial laceration, initial encounter     Tests performed today include: CT scan of your head and facial bones that did not show any serious injury. Vital signs. See below for your results today.   Medications prescribed:  None  Take any prescribed medications only as directed.  Home care instructions:  Follow any educational materials contained in this packet.  Follow-up instructions: Please follow-up with your primary care provider as needed for further evaluation of your symptoms.   Return instructions:  SEEK IMMEDIATE MEDICAL ATTENTION IF: There is confusion or drowsiness (although children frequently become drowsy after injury).  You cannot awaken the injured person.  You have more than one episode of vomiting.  You notice dizziness or unsteadiness which is getting worse, or inability to walk.  You have convulsions or unconsciousness.  You experience severe, persistent headaches not relieved by Tylenol. You cannot use arms or legs normally.  There are changes in pupil sizes. (This is the black center in the colored part of the eye)  There is clear or bloody discharge from the nose or ears.  You have change in speech, vision, swallowing, or understanding.  Localized weakness, numbness, tingling, or change in bowel or bladder control. You have any other emergent concerns.  Additional Information: You have had a head injury which does not appear to require admission at this time.  Your vital signs today were: BP (!) 156/109 (BP Location: Left Arm)   Pulse (!) 55   Temp 98.2 F (36.8 C) (Oral)   Resp 16   SpO2 99%  If your blood pressure (BP) was elevated above 135/85 this visit, please have this repeated by your doctor within one month. --------------
